# Patient Record
Sex: Male | Born: 1955 | Race: Black or African American | Hispanic: No | Marital: Married | Smoking: Current every day smoker
Health system: Southern US, Community
[De-identification: ages and names within clinical notes are randomized; demographics above are authoritative.]

---

## 2013-05-17 DIAGNOSIS — F1410 Cocaine abuse, uncomplicated: Secondary | ICD-10-CM | POA: Insufficient documentation

## 2013-05-17 DIAGNOSIS — F1010 Alcohol abuse, uncomplicated: Secondary | ICD-10-CM | POA: Insufficient documentation

## 2013-05-17 DIAGNOSIS — F329 Major depressive disorder, single episode, unspecified: Secondary | ICD-10-CM | POA: Insufficient documentation

## 2018-06-04 ENCOUNTER — Observation Stay
Admission: EM | Admit: 2018-06-04 | Discharge: 2018-06-05 | Disposition: A | Discharge status: Home / Self Care | Payer: PRIVATE HEALTH INSURANCE | Attending: Family Medicine | Admitting: Family Medicine

## 2018-06-04 ENCOUNTER — Emergency Department: Payer: PRIVATE HEALTH INSURANCE

## 2018-06-04 ENCOUNTER — Encounter: Payer: Self-pay | Admitting: Intensive Care

## 2018-06-04 ENCOUNTER — Other Ambulatory Visit: Payer: Self-pay

## 2018-06-04 DIAGNOSIS — F17210 Nicotine dependence, cigarettes, uncomplicated: Secondary | ICD-10-CM | POA: Insufficient documentation

## 2018-06-04 DIAGNOSIS — R000 Tachycardia, unspecified: Secondary | ICD-10-CM | POA: Diagnosis not present

## 2018-06-04 DIAGNOSIS — R079 Chest pain, unspecified: Secondary | ICD-10-CM | POA: Diagnosis not present

## 2018-06-04 DIAGNOSIS — E1165 Type 2 diabetes mellitus with hyperglycemia: Secondary | ICD-10-CM | POA: Insufficient documentation

## 2018-06-04 DIAGNOSIS — I081 Rheumatic disorders of both mitral and tricuspid valves: Secondary | ICD-10-CM | POA: Diagnosis not present

## 2018-06-04 DIAGNOSIS — R918 Other nonspecific abnormal finding of lung field: Secondary | ICD-10-CM | POA: Insufficient documentation

## 2018-06-04 LAB — BASIC METABOLIC PANEL
Anion gap: 7 (ref 5–15)
BUN: 13 mg/dL (ref 8–23)
CO2: 21 mmol/L — ABNORMAL LOW (ref 22–32)
Calcium: 8.9 mg/dL (ref 8.9–10.3)
Chloride: 108 mmol/L (ref 98–111)
Creatinine, Ser: 1.18 mg/dL (ref 0.61–1.24)
GFR calc Af Amer: >60 mL/min (ref >60)
GFR calc non Af Amer: >60 mL/min (ref >60)
Glucose, Bld: 204 mg/dL — ABNORMAL HIGH (ref 70–99)
POTASSIUM: 4 mmol/L (ref 3.5–5.1)
Sodium: 136 mmol/L (ref 135–145)

## 2018-06-04 LAB — CBC
HCT: 51.3 % (ref 39.0–52.0)
Hemoglobin: 17.4 g/dL — ABNORMAL HIGH (ref 13.0–17.0)
MCH: 32.1 pg (ref 26.0–34.0)
MCHC: 33.9 g/dL (ref 30.0–36.0)
MCV: 94.6 fL (ref 80.0–100.0)
NRBC: 0 % (ref 0.0–0.2)
Platelets: 305 10*3/uL (ref 150–400)
RBC: 5.42 MIL/uL (ref 4.22–5.81)
RDW: 12.7 % (ref 11.5–15.5)
WBC: 9.1 10*3/uL (ref 4.0–10.5)

## 2018-06-04 LAB — TROPONIN I
Troponin I: <0.03 ng/mL (ref <0.03)
Troponin I: <0.03 ng/mL (ref <0.03)

## 2018-06-04 LAB — TSH: TSH: 2.346 u[IU]/mL (ref 0.350–4.500)

## 2018-06-04 LAB — GLUCOSE, CAPILLARY: Glucose-Capillary: 127 mg/dL — ABNORMAL HIGH (ref 70–99)

## 2018-06-04 MED ORDER — INSULIN ASPART 100 UNIT/ML ~~LOC~~ SOLN
0.0000 [IU] | Freq: Three times a day (TID) | SUBCUTANEOUS | Status: DC
  Administered 2018-06-05: 3 [IU] via SUBCUTANEOUS
  Filled 2018-06-04: qty 1

## 2018-06-04 MED ORDER — ACETAMINOPHEN 325 MG PO TABS: 650.0000 mg | ORAL_TABLET | ORAL | Status: DC | PRN

## 2018-06-04 MED ORDER — IOHEXOL 350 MG/ML SOLN
75.0000 mL | Freq: Once | INTRAVENOUS | Status: AC | PRN
  Administered 2018-06-04: 75 mL via INTRAVENOUS

## 2018-06-04 MED ORDER — ALPRAZOLAM 0.25 MG PO TABS: 0.2500 mg | ORAL_TABLET | Freq: Two times a day (BID) | ORAL | Status: DC | PRN

## 2018-06-04 MED ORDER — HEPARIN SODIUM (PORCINE) 5000 UNIT/ML IJ SOLN
5000.0000 [IU] | Freq: Three times a day (TID) | INTRAMUSCULAR | Status: DC
  Administered 2018-06-04 – 2018-06-05 (×3): 5000 [IU] via SUBCUTANEOUS
  Filled 2018-06-04 (×3): qty 1

## 2018-06-04 MED ORDER — ASPIRIN EC 325 MG PO TBEC
325.0000 mg | DELAYED_RELEASE_TABLET | Freq: Every day | ORAL | Status: DC
  Administered 2018-06-05: 325 mg via ORAL
  Filled 2018-06-04: qty 1

## 2018-06-04 MED ORDER — INSULIN ASPART 100 UNIT/ML ~~LOC~~ SOLN: 0.0000 [IU] | Freq: Every day | SUBCUTANEOUS | Status: DC

## 2018-06-04 MED ORDER — METOPROLOL TARTRATE 25 MG PO TABS
25.0000 mg | ORAL_TABLET | Freq: Two times a day (BID) | ORAL | Status: DC
  Administered 2018-06-04: 25 mg via ORAL
  Filled 2018-06-04: qty 1

## 2018-06-04 MED ORDER — ATORVASTATIN CALCIUM 20 MG PO TABS: 80.0000 mg | ORAL_TABLET | Freq: Every day | ORAL | Status: DC

## 2018-06-04 MED ORDER — ASPIRIN 81 MG PO CHEW
324.0000 mg | CHEWABLE_TABLET | Freq: Once | ORAL | Status: AC
  Administered 2018-06-04: 324 mg via ORAL
  Filled 2018-06-04: qty 4

## 2018-06-04 MED ORDER — IPRATROPIUM-ALBUTEROL 0.5-2.5 (3) MG/3ML IN SOLN: 3.0000 mL | Freq: Four times a day (QID) | RESPIRATORY_TRACT | Status: DC | PRN

## 2018-06-04 MED ORDER — NITROGLYCERIN 0.4 MG SL SUBL: 0.4000 mg | SUBLINGUAL_TABLET | SUBLINGUAL | Status: DC | PRN

## 2018-06-04 MED ORDER — MORPHINE SULFATE (PF) 2 MG/ML IV SOLN: 2.0000 mg | INTRAVENOUS | Status: DC | PRN

## 2018-06-04 MED ORDER — ONDANSETRON HCL 4 MG/2ML IJ SOLN: 4.0000 mg | Freq: Four times a day (QID) | INTRAMUSCULAR | Status: DC | PRN

## 2018-06-04 NOTE — ED Notes (Signed)
Floor, Carlos Li, called for pt coming

## 2018-06-04 NOTE — Progress Notes (Signed)
Talked to Dr. Emmit Pomfret about patient's still complaining of 5/10 chest pain, order for Nitro given and per MD will round to patient. RN will continue to monitor.

## 2018-06-04 NOTE — ED Notes (Signed)
Pt given cold food tray at this time .

## 2018-06-04 NOTE — ED Triage Notes (Signed)
Patient c/o throbbing chest pain that radiates to neck since yesterday

## 2018-06-04 NOTE — ED Notes (Signed)
ED TO INPATIENT HANDOFF REPORT  Name/Age/Gender Carlos Li 63 y.o. male  Code Status   Home/SNF/Other Home  Chief Complaint Chest Pain  Level of Care/Admitting Diagnosis ED Disposition    ED Disposition Condition Comment   Admit  Hospital Area: [facility] [[id number]]  Level of Care: Telemetry [5]  Diagnosis: Chest pain, rule out acute myocardial infarction [678938]  Admitting Physician: [name] [[id number]]  Attending Physician: [name] [[id number]]  PT Class (Do Not Modify): Observation [104]  PT Acc Code (Do Not Modify): Observation [10022]       Medical History History reviewed. No pertinent past medical history.  Allergies No Known Allergies  IV Location/Drains/Wounds Patient Lines/Drains/Airways Status   Active Line/Drains/Airways    Name:   Placement date:   Placement time:   Site:   Days:   Peripheral IV 06/04/18 Right Forearm   06/04/18    1838    Forearm   less than 1          Labs/Imaging Results for orders placed or performed during the hospital encounter of 06/04/18 (from the past 48 hour(s))  Basic metabolic panel     Status: Abnormal   Collection Time: 06/04/18  4:12 PM  Result Value Ref Range   Sodium 136 135 - 145 mmol/L   Potassium 4.0 3.5 - 5.1 mmol/L   Chloride 108 98 - 111 mmol/L   CO2 21 (L) 22 - 32 mmol/L   Glucose, Bld 204 (H) 70 - 99 mg/dL   BUN 13 8 - 23 mg/dL   Creatinine, Ser 8.24 0.61 - 1.24 mg/dL   Calcium 8.9 8.9 - 23.5 mg/dL   GFR calc non Af Amer >60 >60 mL/min   GFR calc Af Amer >60 >60 mL/min   Anion gap 7 5 - 15    Comment: Performed at [facility], [address] [place], [place] [address]  CBC     Status: Abnormal   Collection Time: 06/04/18  4:12 PM  Result Value Ref Range   WBC 9.1 4.0 - 10.5 K/uL   RBC 5.42 4.22 - 5.81 MIL/uL   Hemoglobin 17.4 (H) 13.0 - 17.0 g/dL   HCT 31.5 40.0 - 86.7 %   MCV 94.6 80.0 - 100.0 fL   MCH 32.1 26.0 - 34.0 pg   MCHC 33.9 30.0  - 36.0 g/dL   RDW 61.9 50.9 - 32.6 %   Platelets 305 150 - 400 K/uL   nRBC 0.0 0.0 - 0.2 %    Comment: Performed at [facility], [address] [place], [place] [address]  Troponin I - ONCE - STAT     Status: None   Collection Time: 06/04/18  4:12 PM  Result Value Ref Range   Troponin I <0.03 <0.03 ng/mL    Comment: Performed at [facility], [address], [place], [place] [address]   Dg Chest 2 View  Result Date: 06/04/2018 CLINICAL DATA:  Chest pain for 2 days. EXAM: CHEST - 2 VIEW COMPARISON:  None. FINDINGS: Peribronchial thickening is identified. Mild flattening of the hemidiaphragms and enlargement of the retrosternal air space are seen. Lungs are clear. Heart size is normal. No pneumothorax or pleural effusion. No acute or focal bony abnormality. IMPRESSION: No acute disease. Findings compatible with COPD. Electronically Signed   By: Drusilla Kanner M.D.   On: 06/04/2018 17:18   Ct Angio Chest Pe W And/or Wo Contrast  Result Date: 06/04/2018 CLINICAL DATA:  Throbbing chest pain radiating  to neck since yesterday. EXAM: CT ANGIOGRAPHY CHEST WITH CONTRAST TECHNIQUE: Multidetector CT imaging of the chest was performed using the standard protocol during bolus administration of intravenous contrast. Multiplanar CT image reconstructions and MIPs were obtained to evaluate the vascular anatomy. CONTRAST:  75mL OMNIPAQUE IOHEXOL 350 MG/ML SOLN COMPARISON:  Chest radiograph 06/04/2018 FINDINGS: CARDIOVASCULAR: Adequate contrast opacification of the pulmonary artery's. Main pulmonary artery is not enlarged. No pulmonary arterial filling defects to the level of the subsegmental branches. Heart size is normal. Trace coronary artery calcification. No pericardial effusion. Thoracic aorta is normal course and caliber, unremarkable. MEDIASTINUM/NODES: No lymphadenopathy by CT size criteria. Small calcified LEFT hilar lymph nodes. LUNGS/PLEURA: Tracheobronchial tree is patent, no  pneumothorax. Mild bronchial wall thickening. 7 mm RIGHT upper lobe subsolid pulmonary nodule (series 6, image 46), 5 mm LEFT lower lobe ground-glass nodule (series 6, image 48). Mild mosaic lung attenuation. UPPER ABDOMEN: Non-acute.  Punctate calcified splenic granuloma. MUSCULOSKELETAL: Non-acute. Mildly thickened and sclerotic RIGHT posterior eighth rib suggesting Paget's disease. No destructive bony lesions. Review of the MIP images confirms the above findings. IMPRESSION: 1. No acute pulmonary embolism. 2. Bronchial wall thickening seen with bronchitis or reactive airway disease. CT findings of small airway disease. No pneumonia. 3. **An incidental finding of potential clinical significance has been found. Multiple pulmonary nodules measuring to 7 mm. Non-contrast chest CT at 3-6 months is recommended. If nodules persist, subsequent management will be based upon the most suspicious nodule(s). This recommendation follows the consensus statement: Guidelines for Management of Incidental Pulmonary Nodules Detected on CT Images: From the Fleischner Society 2017; Radiology 2017; 284:228-243.** Electronically Signed   By: Awilda Metroourtnay  Bloomer M.D.   On: 06/04/2018 19:15    Pending Labs [employer] CorporationUnresulted Labs (From admission, onward)    Start     Ordered   Signed and Held  HIV antibody (Routine Testing)  Once,   R     Signed and Held   Signed and Held  Troponin I - Now Then Q3H  Now then every 3 hours,   TIMED     Signed and Held   Signed and Held  CBC  (heparin)  Once,   R    Comments:  Baseline for heparin therapy IF NOT ALREADY DRAWN.  Notify MD if PLT < 100 K.    Signed and Held   Signed and Held  Creatinine, serum  (heparin)  Once,   R    Comments:  Baseline for heparin therapy IF NOT ALREADY DRAWN.    Signed and Held   Signed and Held  TSH  Once,   R     Signed and Held   Signed and Held  Lipid panel  Tomorrow morning,   R     Signed and Held   Signed and Held  Hemoglobin A1c  Once,   R    Comments:   To assess prior glycemic control    Signed and Held          Vitals/Pain Today's Vitals   06/04/18 1608 06/04/18 1730 06/04/18 1738 06/04/18 1800  BP: 129/75 116/72  124/85  Pulse: 96 69  73  Resp: 16   19  Temp: 97.8 F (36.6 C)     TempSrc: Oral     SpO2: 96% 96%  96%  Weight:      Height:      PainSc:   8      Isolation Precautions No active isolations  Medications Medications  iohexol (OMNIPAQUE) 350 MG/ML  injection 75 mL (75 mLs Intravenous Contrast Given 06/04/18 1857)  aspirin chewable tablet 324 mg (324 mg Oral Given 06/04/18 2030)    Mobility walks

## 2018-06-04 NOTE — ED Provider Notes (Signed)
[facility] Emergency Department Provider Note  ____________________________________________   I have reviewed the triage vital signs and the nursing notes. Where available I have reviewed prior notes and, if possible and indicated, outside hospital notes.    HISTORY  Chief Complaint Chest Pain    HPI Carlos Li is a 63 y.o. male  with a history of extensive tobacco abuse, he does not regularly see Dr. states over the last 2 days he is had chest pain, mostly right after he ambulates, he has to stop.  The pain lasts for a brief period of time and goes away.  It is "everywhere in his chest", goes up towards his arms and his neck.  And then gradually subsides.  It is a "pain", he cannot further describe it. Marland Kitchen.  He denies any nausea or vomiting he is not particularly short of breath he has no leg swelling.  The patient is a very limited historian level 5 chart caveat; no further history available due to patient status.   History reviewed. No pertinent past medical history.  There are no active problems to display for this patient.   History reviewed. No pertinent surgical history.  Prior to Admission medications   Not on File    Allergies Patient has no known allergies.  History reviewed. No pertinent family history.  Social History Social History   Tobacco Use  . Smoking status: Current Every Day Smoker    Types: Cigarettes  . Smokeless tobacco: Never Used  Substance Use Topics  . Alcohol use: Yes  . Drug use: Not Currently    Review of Systems Constitutional: No fever/chills Eyes: No visual changes. ENT: No sore throat. No stiff neck no neck pain Cardiovascular: + chest pain. Respiratory: Denies shortness of breath. Gastrointestinal:   no vomiting.  No diarrhea.  No constipation. Genitourinary: Negative for dysuria. Musculoskeletal: Negative lower extremity swelling Skin: Negative for rash. Neurological: Negative for severe  headaches, focal weakness or numbness.   ____________________________________________   PHYSICAL EXAM:  VITAL SIGNS: ED Triage Vitals  Enc Vitals Group     BP 06/04/18 1608 129/75     Pulse Rate 06/04/18 1608 96     Resp 06/04/18 1608 16     Temp 06/04/18 1608 97.8 F (36.6 C)     Temp Source 06/04/18 1608 Oral     SpO2 06/04/18 1608 96 %     Weight 06/04/18 1605 150 lb (68 kg)     Height 06/04/18 1605 5\' 7"  (1.702 m)     Head Circumference --      Peak Flow --      Pain Score 06/04/18 1605 7     Pain Loc --      Pain Edu? --      Excl. in GC? --     Constitutional: Alert and oriented. Well appearing and in no acute distress. Eyes: Conjunctivae are normal Head: Atraumatic HEENT: No congestion/rhinnorhea. Mucous membranes are moist.  Oropharynx non-erythematous Neck:   Nontender with no meningismus, no masses, no stridor Cardiovascular: Normal rate, regular rhythm. Grossly normal heart sounds.  Good peripheral circulation. Respiratory: Normal respiratory effort.  No retractions. Lungs CTAB. Abdominal: Soft and nontender. No distention. No guarding no rebound Back:  There is no focal tenderness or step off.  there is no midline tenderness there are no lesions noted. there is no CVA tenderness Musculoskeletal: No lower extremity tenderness, no upper extremity tenderness. No joint effusions, no DVT signs strong distal pulses  no edema Neurologic:  Normal speech and language. No gross focal neurologic deficits are appreciated.  Skin:  Skin is warm, dry and intact. No rash noted. Psychiatric: Mood and affect are normal. Speech and behavior are normal.  ____________________________________________   LABS (all labs ordered are listed, but only abnormal results are displayed)  Labs Reviewed  BASIC METABOLIC PANEL - Abnormal; Notable for the following components:      Result Value   CO2 21 (*)    Glucose, Bld 204 (*)    All other components within normal limits  CBC -  Abnormal; Notable for the following components:   Hemoglobin 17.4 (*)    All other components within normal limits  TROPONIN I    Pertinent labs  results that were available during my care of the patient were reviewed by me and considered in my medical decision making (see chart for details). ____________________________________________  EKG  I personally interpreted any EKGs ordered by me or triage Sinus tachycardia rate 102, no acute ST elevation or depression normal axis ______________________________________  RADIOLOGY  Pertinent labs & imaging results that were available during my care of the patient were reviewed by me and considered in my medical decision making (see chart for details). If possible, patient and/or family made aware of any abnormal findings.  Dg Chest 2 View  Result Date: 06/04/2018 CLINICAL DATA:  Chest pain for 2 days. EXAM: CHEST - 2 VIEW COMPARISON:  None. FINDINGS: Peribronchial thickening is identified. Mild flattening of the hemidiaphragms and enlargement of the retrosternal air space are seen. Lungs are clear. Heart size is normal. No pneumothorax or pleural effusion. No acute or focal bony abnormality. IMPRESSION: No acute disease. Findings compatible with COPD. Electronically Signed   By: Drusilla Kanner M.D.   On: 06/04/2018 17:18   Ct Angio Chest Pe W And/or Wo Contrast  Result Date: 06/04/2018 CLINICAL DATA:  Throbbing chest pain radiating to neck since yesterday. EXAM: CT ANGIOGRAPHY CHEST WITH CONTRAST TECHNIQUE: Multidetector CT imaging of the chest was performed using the standard protocol during bolus administration of intravenous contrast. Multiplanar CT image reconstructions and MIPs were obtained to evaluate the vascular anatomy. CONTRAST:  35mL OMNIPAQUE IOHEXOL 350 MG/ML SOLN COMPARISON:  Chest radiograph 06/04/2018 FINDINGS: CARDIOVASCULAR: Adequate contrast opacification of the pulmonary artery's. Main pulmonary artery is not enlarged. No  pulmonary arterial filling defects to the level of the subsegmental branches. Heart size is normal. Trace coronary artery calcification. No pericardial effusion. Thoracic aorta is normal course and caliber, unremarkable. MEDIASTINUM/NODES: No lymphadenopathy by CT size criteria. Small calcified LEFT hilar lymph nodes. LUNGS/PLEURA: Tracheobronchial tree is patent, no pneumothorax. Mild bronchial wall thickening. 7 mm RIGHT upper lobe subsolid pulmonary nodule (series 6, image 46), 5 mm LEFT lower lobe ground-glass nodule (series 6, image 48). Mild mosaic lung attenuation. UPPER ABDOMEN: Non-acute.  Punctate calcified splenic granuloma. MUSCULOSKELETAL: Non-acute. Mildly thickened and sclerotic RIGHT posterior eighth rib suggesting Paget's disease. No destructive bony lesions. Review of the MIP images confirms the above findings. IMPRESSION: 1. No acute pulmonary embolism. 2. Bronchial wall thickening seen with bronchitis or reactive airway disease. CT findings of small airway disease. No pneumonia. 3. **An incidental finding of potential clinical significance has been found. Multiple pulmonary nodules measuring to 7 mm. Non-contrast chest CT at 3-6 months is recommended. If nodules persist, subsequent management will be based upon the most suspicious nodule(s). This recommendation follows the consensus statement: Guidelines for Management of Incidental Pulmonary Nodules Detected on CT Images: From the Fleischner  Society 2017; Radiology 2017; [id number]:228-243.** Electronically Signed   By: Awilda Metroourtnay  Bloomer M.D.   On: 06/04/2018 19:15   ____________________________________________    PROCEDURES  Procedure(s) performed: None  Procedures  Critical Care performed: None  ____________________________________________   INITIAL IMPRESSION / ASSESSMENT AND PLAN / ED COURSE  Pertinent labs & imaging results that were available during my care of the patient were reviewed by me and considered in my medical decision  making (see chart for details).  Here with multiple risk factors for CAD including tobacco abuse, he does not follow regularly with doctors he states, presents today complaining of chest pain which seems to be exertional and seems to get better at rest.  Patient states it radiates towards his neck.  I did do a CT scan which is negative fortunately for PE or evidence of dissection.  Low suspicion for dissection given the quality and nature of his pain as he describes it.  Patient is remarkably poor historian.  Is very difficult to calculate a heart score is with very little information on him but I suspect it to be elevated.  Given that he has exertional chest pain multiple risk factors are presumed including hyperglycemia suggestive of an undiagnosed diabetes etc. we will admit the patient for further observation    ____________________________________________   FINAL CLINICAL IMPRESSION(S) / ED DIAGNOSES  Final diagnoses:  Chest pain, unspecified type      This chart was dictated using voice recognition software.  Despite best efforts to proofread,  errors can occur which can change meaning.  Jeanmarie Plant\   Reather Steller A, MD 06/04/18 2024

## 2018-06-04 NOTE — H&P (Signed)
[facility] [place] [facility] Admission History and Physical [name], D.O.  ---------------------------------------------------------------------------------------------------------------------   PATIENT NAME: Carlos Li MR#: [id number] DATE OF BIRTH: 1955-06-28 DATE OF ADMISSION: 06/04/2018 PRIMARY CARE PHYSICIAN: Patient, No Pcp Per  REQUESTING/REFERRING PHYSICIAN: ED Dr. Alphonzo Lemmings  CHIEF COMPLAINT: Chief Complaint  Patient presents with  . Chest Pain    HISTORY OF PRESENT ILLNESS: Carlos Li is a 63 y.o. male with a known history of tobacco use, doesn't see doctors often, presents to the emergency department for evaluation of chest pain.  Patient was in a usual state of health until yesterday when he reports the onset of left sided chest pain radiating into his left arm, occurs during ambulation improves with rest.  Pain has been intermittent over the past 24 hours.  No associated with shortness of breath, nausea, diaphoresis.  He reports a chronic nighttime cough.  He admits that he has not been to a doctor in many years, is unaware of any other medical problems.  Has used cocaine in the past, last time being 2-3 weeks ago.  Smoke 1/3rd pack of cigarettes daily, drinks 1 40 ounce beer daily.  Works [employer] trader.    Otherwise there has been no change in status. Patient has been taking medication as prescribed and there has been no recent change in medication or diet.  There has been no recent illness, travel or sick contacts.    Patient denies fevers/chills, weakness, dizziness, shortness of breath, N/V/C/D, abdominal pain, dysuria/frequency, changes in mental status.   EMS/ED COURSE:   Patient received aspirin.  Medical admission was requested for further workup and management of chest pain, rule out ACS  PAST MEDICAL HISTORY: Denies    PAST SURGICAL HISTORY: Abdominal and knee surgeries in the past    SOCIAL HISTORY: Social History   Tobacco Use   . Smoking status: Current Every Day Smoker    Types: Cigarettes  . Smokeless tobacco: Never Used  Substance Use Topics  . Alcohol use: Yes      FAMILY HISTORY: Denies   MEDICATIONS AT HOME: Prior to Admission medications   Not on File   Takes no medications  DRUG ALLERGIES: No Known Allergies   REVIEW OF SYSTEMS: CONSTITUTIONAL: No fatigue, weakness, fever, chills, weight gain/loss, headache EYES: No blurry or double vision. ENT: No tinnitus, postnasal drip, redness or soreness of the oropharynx. RESPIRATORY: No dyspnea, cough, wheeze, hemoptysis. CARDIOVASCULAR: Positive chest and left arm pain, negative orthopnea, palpitations, syncope. GASTROINTESTINAL: No nausea, vomiting, constipation, diarrhea, abdominal pain. No hematemesis, melena or hematochezia. GENITOURINARY: No dysuria, frequency, hematuria. ENDOCRINE: No polyuria or nocturia. No heat or cold intolerance. HEMATOLOGY: No anemia, bruising, bleeding. INTEGUMENTARY: No rashes, ulcers, lesions. MUSCULOSKELETAL: No pain, arthritis, swelling, gout. NEUROLOGIC: No numbness, tingling, weakness or ataxia. No seizure-type activity. PSYCHIATRIC: No anxiety, depression, insomnia.  PHYSICAL EXAMINATION: VITAL SIGNS: Blood pressure 124/85, pulse 73, temperature 97.8 F (36.6 C), temperature source Oral, resp. rate 19, height 5\' 7"  (1.702 m), weight 68 kg, SpO2 96 %.  GENERAL: 63 y.o.-year-old male patient, well-developed, well-nourished lying in the bed in no acute distress.  Pleasant and cooperative.   HEENT: Head atraumatic, normocephalic. Pupils equal, round, reactive to light and accommodation. No scleral icterus. Extraocular muscles intact. Oropharynx is clear. Mucus membranes moist. NECK: Supple, full range of motion. No JVD, no bruit heard. No cervical lymphadenopathy. CHEST: Normal breath sounds bilaterally. No wheezing, rales, rhonchi or crackles. No use of accessory muscles of respiration.  No reproducible chest  wall tenderness.  CARDIOVASCULAR: S1, S2 normal. No murmurs, rubs, or gallops appreciated. Cap refill <2 seconds. ABDOMEN: Soft, nontender, nondistended. No rebound, guarding, rigidity. Normoactive bowel sounds present in all four quadrants. No organomegaly or mass. EXTREMITIES: Full range of motion. No pedal edema, cyanosis, or clubbing. NEUROLOGIC: Cranial nerves II through XII are grossly intact with no focal sensorimotor deficit. Muscle strength 5/5 in all extremities. Sensation intact. Gait not checked. PSYCHIATRIC: The patient is alert and oriented x 3. Normal affect, mood, thought content. SKIN: Warm, dry, and intact without obvious rash, lesion, or ulcer.  LABORATORY PANEL:  CBC Recent Labs  Lab 06/04/18 1612  WBC 9.1  HGB 17.4*  HCT 51.3  PLT 305   ----------------------------------------------------------------------------------------------------------------- Chemistries Recent Labs  Lab 06/04/18 1612  NA 136  K 4.0  CL 108  CO2 21*  GLUCOSE 204*  BUN 13  CREATININE 1.18  CALCIUM 8.9   ------------------------------------------------------------------------------------------------------------------ Cardiac Enzymes Recent Labs  Lab 06/04/18 1612  TROPONINI <0.03   ------------------------------------------------------------------------------------------------------------------  RADIOLOGY: Dg Chest 2 View  Result Date: 06/04/2018 CLINICAL DATA:  Chest pain for 2 days. EXAM: CHEST - 2 VIEW COMPARISON:  None. FINDINGS: Peribronchial thickening is identified. Mild flattening of the hemidiaphragms and enlargement of the retrosternal air space are seen. Lungs are clear. Heart size is normal. No pneumothorax or pleural effusion. No acute or focal bony abnormality. IMPRESSION: No acute disease. Findings compatible with COPD. Electronically Signed   By: Drusilla Kannerhomas  Dalessio M.D.   On: 06/04/2018 17:18   Ct Angio Chest Pe W And/or Wo Contrast  Result Date:  06/04/2018 CLINICAL DATA:  Throbbing chest pain radiating to neck since yesterday. EXAM: CT ANGIOGRAPHY CHEST WITH CONTRAST TECHNIQUE: Multidetector CT imaging of the chest was performed using the standard protocol during bolus administration of intravenous contrast. Multiplanar CT image reconstructions and MIPs were obtained to evaluate the vascular anatomy. CONTRAST:  75mL OMNIPAQUE IOHEXOL 350 MG/ML SOLN COMPARISON:  Chest radiograph 06/04/2018 FINDINGS: CARDIOVASCULAR: Adequate contrast opacification of the pulmonary artery's. Main pulmonary artery is not enlarged. No pulmonary arterial filling defects to the level of the subsegmental branches. Heart size is normal. Trace coronary artery calcification. No pericardial effusion. Thoracic aorta is normal course and caliber, unremarkable. MEDIASTINUM/NODES: No lymphadenopathy by CT size criteria. Small calcified LEFT hilar lymph nodes. LUNGS/PLEURA: Tracheobronchial tree is patent, no pneumothorax. Mild bronchial wall thickening. 7 mm RIGHT upper lobe subsolid pulmonary nodule (series 6, image 46), 5 mm LEFT lower lobe ground-glass nodule (series 6, image 48). Mild mosaic lung attenuation. UPPER ABDOMEN: Non-acute.  Punctate calcified splenic granuloma. MUSCULOSKELETAL: Non-acute. Mildly thickened and sclerotic RIGHT posterior eighth rib suggesting Paget's disease. No destructive bony lesions. Review of the MIP images confirms the above findings. IMPRESSION: 1. No acute pulmonary embolism. 2. Bronchial wall thickening seen with bronchitis or reactive airway disease. CT findings of small airway disease. No pneumonia. 3. **An incidental finding of potential clinical significance has been found. Multiple pulmonary nodules measuring to 7 mm. Non-contrast chest CT at 3-6 months is recommended. If nodules persist, subsequent management will be based upon the most suspicious nodule(s). This recommendation follows the consensus statement: Guidelines for Management of  Incidental Pulmonary Nodules Detected on CT Images: From the Fleischner Society 2017; Radiology 2017; 284:228-243.** Electronically Signed   By: Awilda Metroourtnay  Bloomer M.D.   On: 06/04/2018 19:15    EKG: Sinus tach @ 102 bpm with normal axis and nonspecific ST-T wave changes.   IMPRESSION AND PLAN:  This is a 63 y.o. male with a  history of tobacco use now being admitted with: 1. Chest pain, rule out ACS - Admit to observation with telemetry monitoring. - Trend troponins, check lipids and TSH. - Morphine, nitro, beta blocker, aspirin and statin ordered.   - Check echo - Cardiology consult requested.   2. Possibly bronchitis vs. RAD - O2, Nebs PRN,  2. Hyperglycemia, new onset DM - Accuchecks achs with RISS coverage - Heart healthy, carb controlled diet - Check A1C  Admission status: Observation, telemetry Diet/Nutrition: Heart healthy Fluids: HL DVT Px: Lovenox, SCDs and early ambulation Code Status: Full Disposition Plan: To home in <24 hours  All the records are reviewed and case discussed with ED provider. Management plans discussed with the patient and/or family who express understanding and agree with plan of care.   TOTAL TIME TAKING CARE OF THIS PATIENT: 60 minutes.   Candy Ziegler D.O. on 06/04/2018 at 9:00 PM Between 7am to 6pm - Pager - [phone] After 6pm go to [email or web address] - Social research officer, government [facility] Office [phone] CC: Primary care physician; Patient, No Pcp Per     Note: This dictation was prepared with Dragon dictation along with smaller phrase technology. Any transcriptional errors that result from this process are unintentional.

## 2018-06-05 ENCOUNTER — Observation Stay: Payer: PRIVATE HEALTH INSURANCE

## 2018-06-05 ENCOUNTER — Observation Stay
Admit: 2018-06-05 | Discharge: 2018-06-05 | Disposition: A | Discharge status: Home / Self Care | Payer: PRIVATE HEALTH INSURANCE | Attending: Family Medicine | Admitting: Family Medicine

## 2018-06-05 ENCOUNTER — Other Ambulatory Visit: Payer: Self-pay

## 2018-06-05 LAB — NM MYOCAR MULTI W/SPECT W/WALL MOTION / EF
CSEPED: 7 min
Estimated workload: 9.3 METS
Exercise duration (sec): 31 s
LV dias vol: 32 mL (ref 62–150)
LV sys vol: 10 mL
Peak HR: 113 {beats}/min
Rest HR: 65 {beats}/min
SDS: 0
SRS: 0
SSS: 0
TID: 0.9

## 2018-06-05 LAB — LIPID PANEL
Cholesterol: 151 mg/dL (ref 0–200)
HDL: 49 mg/dL (ref >40)
LDL Cholesterol: 61 mg/dL (ref 0–99)
Total CHOL/HDL Ratio: 3.1 RATIO
Triglycerides: 206 mg/dL — ABNORMAL HIGH (ref <150)
VLDL: 41 mg/dL — ABNORMAL HIGH (ref 0–40)

## 2018-06-05 LAB — TROPONIN I: Troponin I: <0.03 ng/mL (ref <0.03)

## 2018-06-05 LAB — HEMOGLOBIN A1C
Hgb A1c MFr Bld: 5.5 % (ref 4.8–5.6)
Mean Plasma Glucose: 111.15 mg/dL

## 2018-06-05 LAB — GLUCOSE, CAPILLARY
Glucose-Capillary: 130 mg/dL — ABNORMAL HIGH (ref 70–99)
Glucose-Capillary: 153 mg/dL — ABNORMAL HIGH (ref 70–99)

## 2018-06-05 LAB — ECHOCARDIOGRAM COMPLETE
Height: 67 in
Weight: 2385.6 oz

## 2018-06-05 MED ORDER — AZITHROMYCIN 250 MG PO TABS: ORAL_TABLET | ORAL | 0 refills | Status: DC

## 2018-06-05 MED ORDER — TECHNETIUM TC 99M TETROFOSMIN IV KIT
30.5800 | PACK | Freq: Once | INTRAVENOUS | Status: AC | PRN
  Administered 2018-06-05: 30.58 via INTRAVENOUS

## 2018-06-05 MED ORDER — MORPHINE SULFATE (PF) 2 MG/ML IV SOLN
1.0000 mg | INTRAVENOUS | Status: DC | PRN
  Administered 2018-06-05: 1 mg via INTRAVENOUS
  Filled 2018-06-05: qty 1

## 2018-06-05 MED ORDER — PANTOPRAZOLE SODIUM 40 MG IV SOLR
40.0000 mg | INTRAVENOUS | Status: DC
  Administered 2018-06-05: 40 mg via INTRAVENOUS
  Filled 2018-06-05: qty 40

## 2018-06-05 MED ORDER — ASPIRIN EC 81 MG PO TBEC: 81.0000 mg | DELAYED_RELEASE_TABLET | Freq: Every day | ORAL | 0 refills | Status: DC

## 2018-06-05 MED ORDER — METOPROLOL TARTRATE 25 MG PO TABS: 25.0000 mg | ORAL_TABLET | Freq: Two times a day (BID) | ORAL | 0 refills | Status: DC

## 2018-06-05 MED ORDER — ATORVASTATIN CALCIUM 80 MG PO TABS: 80.0000 mg | ORAL_TABLET | Freq: Every day | ORAL | 0 refills | Status: DC

## 2018-06-05 MED ORDER — TECHNETIUM TC 99M TETROFOSMIN IV KIT
10.0300 | PACK | Freq: Once | INTRAVENOUS | Status: AC | PRN
  Administered 2018-06-05: 10.03 via INTRAVENOUS

## 2018-06-05 NOTE — Discharge Summary (Signed)
[facility] [place] [facility]   PATIENT NAME: Carlos Li    MR#:  [id number]  DATE OF BIRTH:  06/03/1955  DATE OF ADMISSION:  06/04/2018 ADMITTING PHYSICIAN: Jon Gills Hugelmeyer, DO  DATE OF DISCHARGE: No discharge date for patient encounter.  PRIMARY CARE PHYSICIAN: Patient, No Pcp Per    ADMISSION DIAGNOSIS:  Chest pain, unspecified type [R07.9]  DISCHARGE DIAGNOSIS:  Active Problems:   Chest pain, rule out acute myocardial infarction   SECONDARY DIAGNOSIS:  History reviewed. No pertinent past medical history.  HOSPITAL COURSE:  This is a 63 y.o. male with a history of tobacco use now being admitted with  *Acute chest pain Resolved Did r/o for ACS Cardiology did see patient while in house, treated on our ACS protocol while in house, echocardiogram done-report pending at the time of discharge, stress testing nl  * Acute possible bronchitis versus RAD Treated with breathing treatments PRN, tobacco cessation suggested, Zpack  *Acute hyperglycemia, mild Globin A1c is normal  *Acute abnormal CT of the chest Noted for bronchitis changes/pulmonary nodules Follow-up primary care provider status post discharge for continued care/medical management  DISCHARGE CONDITIONS:  stable  CONSULTS OBTAINED:  Treatment Team:  Marcina Millard, MD  DRUG ALLERGIES:  No Known Allergies  DISCHARGE MEDICATIONS:   Allergies as of 06/05/2018   No Known Allergies     Medication List    TAKE these medications   aspirin EC 81 MG tablet Take 1 tablet (81 mg total) by mouth daily.   atorvastatin 80 MG tablet Commonly known as:  LIPITOR Take 1 tablet (80 mg total) by mouth daily at 6 PM.   azithromycin 250 MG tablet Commonly known as:  ZITHROMAX Take as directed   metoprolol tartrate 25 MG tablet Commonly known as:  LOPRESSOR Take 1 tablet (25 mg total) by mouth 2 (two) times daily.        DISCHARGE INSTRUCTIONS:   If you experience  worsening of your admission symptoms, develop shortness of breath, life threatening emergency, suicidal or homicidal thoughts you must seek medical attention immediately by calling 911 or calling your MD immediately  if symptoms less severe.  You Must read complete instructions/literature along with all the possible adverse reactions/side effects for all the Medicines you take and that have been prescribed to you. Take any new Medicines after you have completely understood and accept all the possible adverse reactions/side effects.   Please note  You were cared for by a hospitalist during your hospital stay. If you have any questions about your discharge medications or the care you received while you were in the hospital after you are discharged, you can call the unit and asked to speak with the hospitalist on call if the hospitalist that took care of you is not available. Once you are discharged, your primary care physician will handle any further medical issues. Please note that NO REFILLS for any discharge medications will be authorized once you are discharged, as it is imperative that you return to your primary care physician (or establish a relationship with a primary care physician if you do not have one) for your aftercare needs so that they can reassess your need for medications and monitor your lab values.    Today   CHIEF COMPLAINT:   Chief Complaint  Patient presents with  . Chest Pain    HISTORY OF PRESENT ILLNESS:  63 y.o. male with a known history of tobacco use, doesn't see doctors often, presents to the  emergency department for evaluation of chest pain.  Patient was in a usual state of health until yesterday when he reports the onset of left sided chest pain radiating into his left arm, occurs during ambulation improves with rest.  Pain has been intermittent over the past 24 hours.  No associated with shortness of breath, nausea, diaphoresis.  He reports a chronic nighttime cough.   He admits that he has not been to a doctor in many years, is unaware of any other medical problems.  Has used cocaine in the past, last time being 2-3 weeks ago.  Smoke 1/3rd pack of cigarettes daily, drinks 1 40 ounce beer daily.  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    Otherwise there has been no change in status. Patient has been taking medication as prescribed and there has been no recent change in medication or diet.  There has been no recent illness, travel or sick contacts.    Patient denies fevers/chills, weakness, dizziness, shortness of breath, N/V/C/D, abdominal pain, dysuria/frequency, changes in mental status.   VITAL SIGNS:  Blood pressure 92/61, pulse (!) 56, temperature 97.7 F (36.5 C), temperature source Oral, resp. rate 16, height 5\' 7"  (1.702 m), weight 67.6 kg, SpO2 100 %.  I/O:    Intake/Output Summary (Last 24 hours) at 06/05/2018 1303 Last data filed at 06/05/2018 0550 Gross per 24 hour  Intake -  Output 400 ml  Net -400 ml    PHYSICAL EXAMINATION:  GENERAL:  63 y.o.-year-old patient lying in the bed with no acute distress.  EYES: Pupils equal, round, reactive to light and accommodation. No scleral icterus. Extraocular muscles intact.  HEENT: Head atraumatic, normocephalic. Oropharynx and nasopharynx clear.  NECK:  Supple, no jugular venous distention. No thyroid enlargement, no tenderness.  LUNGS: Normal breath sounds bilaterally, no wheezing, rales,rhonchi or crepitation. No use of accessory muscles of respiration.  CARDIOVASCULAR: S1, S2 normal. No murmurs, rubs, or gallops.  ABDOMEN: Soft, non-tender, non-distended. Bowel sounds present. No organomegaly or mass.  EXTREMITIES: No pedal edema, cyanosis, or clubbing.  NEUROLOGIC: Cranial nerves II through XII are intact. Muscle strength 5/5 in all extremities. Sensation intact. Gait not checked.  PSYCHIATRIC: The patient is alert and oriented x 3.  SKIN: No obvious rash, lesion, or ulcer.   DATA REVIEW:    CBC Recent Labs  Lab 06/04/18 1612  WBC 9.1  HGB 17.4*  HCT 51.3  PLT 305    Chemistries  Recent Labs  Lab 06/04/18 1612  NA 136  K 4.0  CL 108  CO2 21*  GLUCOSE 204*  BUN 13  CREATININE 1.18  CALCIUM 8.9    Cardiac Enzymes Recent Labs  Lab 06/05/18 0433  TROPONINI <0.03    Microbiology Results  No results found for this or any previous visit.  RADIOLOGY:  Dg Chest 2 View  Result Date: 06/04/2018 CLINICAL DATA:  Chest pain for 2 days. EXAM: CHEST - 2 VIEW COMPARISON:  None. FINDINGS: Peribronchial thickening is identified. Mild flattening of the hemidiaphragms and enlargement of the retrosternal air space are seen. Lungs are clear. Heart size is normal. No pneumothorax or pleural effusion. No acute or focal bony abnormality. IMPRESSION: No acute disease. Findings compatible with COPD. Electronically Signed   By: Drusilla Kannerhomas  Dalessio M.D.   On: 06/04/2018 17:18   Ct Angio Chest Pe W And/or Wo Contrast  Result Date: 06/04/2018 CLINICAL DATA:  Throbbing chest pain radiating to neck since yesterday. EXAM: CT ANGIOGRAPHY CHEST WITH CONTRAST TECHNIQUE: Multidetector CT imaging of the  chest was performed using the standard protocol during bolus administration of intravenous contrast. Multiplanar CT image reconstructions and MIPs were obtained to evaluate the vascular anatomy. CONTRAST:  46mL OMNIPAQUE IOHEXOL 350 MG/ML SOLN COMPARISON:  Chest radiograph 06/04/2018 FINDINGS: CARDIOVASCULAR: Adequate contrast opacification of the pulmonary artery's. Main pulmonary artery is not enlarged. No pulmonary arterial filling defects to the level of the subsegmental branches. Heart size is normal. Trace coronary artery calcification. No pericardial effusion. Thoracic aorta is normal course and caliber, unremarkable. MEDIASTINUM/NODES: No lymphadenopathy by CT size criteria. Small calcified LEFT hilar lymph nodes. LUNGS/PLEURA: Tracheobronchial tree is patent, no pneumothorax. Mild bronchial  wall thickening. 7 mm RIGHT upper lobe subsolid pulmonary nodule (series 6, image 46), 5 mm LEFT lower lobe ground-glass nodule (series 6, image 48). Mild mosaic lung attenuation. UPPER ABDOMEN: Non-acute.  Punctate calcified splenic granuloma. MUSCULOSKELETAL: Non-acute. Mildly thickened and sclerotic RIGHT posterior eighth rib suggesting Paget's disease. No destructive bony lesions. Review of the MIP images confirms the above findings. IMPRESSION: 1. No acute pulmonary embolism. 2. Bronchial wall thickening seen with bronchitis or reactive airway disease. CT findings of small airway disease. No pneumonia. 3. **An incidental finding of potential clinical significance has been found. Multiple pulmonary nodules measuring to 7 mm. Non-contrast chest CT at 3-6 months is recommended. If nodules persist, subsequent management will be based upon the most suspicious nodule(s). This recommendation follows the consensus statement: Guidelines for Management of Incidental Pulmonary Nodules Detected on CT Images: From the Fleischner Society 2017; Radiology 2017; 284:228-243.** Electronically Signed   By: Awilda Metro M.D.   On: 06/04/2018 19:15   Nm Myocar Multi W/spect W/wall Motion / Ef  Result Date: 06/05/2018  Blood pressure demonstrated a normal response to exercise.  There was no ST segment deviation noted during stress.  The study is normal.  This is a low risk study.  The left ventricular ejection fraction is mildly decreased (45-54%).     EKG:   Orders placed or performed during the hospital encounter of 06/04/18  . ED EKG  . ED EKG  . EKG 12-Lead  . EKG 12-Lead  . EKG 12-Lead (at 6am)  . EKG 12-Lead (Repeat cardiac markers, recurrent chest pain)  . EKG 12-Lead (Repeat cardiac markers, recurrent chest pain)  . EKG 12-Lead (at 6am)      Management plans discussed with the patient, family and they are in agreement.  CODE STATUS:     Code Status Orders  (From admission, onward)          Start     Ordered   06/04/18 2230  Full code  Continuous     06/04/18 2229        Code Status History    This patient has a current code status but no historical code status.      TOTAL TIME TAKING CARE OF THIS PATIENT: 40 minutes.    Evelena Asa Everlina Gotts M.D on 06/05/2018 at 1:03 PM  Between 7am to 6pm - Pager - [phone]  After 6pm go to [email or web address] - password [email or web address]  [facility]  Office  [phone]  CC: Primary care physician; Patient, No Pcp Per   Note: This dictation was prepared with Dragon dictation along with smaller phrase technology. Any transcriptional errors that result from this process are unintentional.

## 2018-06-05 NOTE — Progress Notes (Signed)
Pt has remained alert and oriented x4 with no c/o pain. Pt has remained on RA, SpO2 WNL, lung sounds clear to auscultation. NDN.  Pt has remained sinus brady on cardiac monitor, BP soft, HR upper 50s/lower 60s.  Pt transported to [facility] for stress test-results are negative-pt with orders for discharge with instructions to follow-up with cardio in 1 wk.  Discharge meds reviewed with Dr Katheren Shams. Per Dr Simeon Craft Metoprolol 25mg . Pt will not be given Metoprolol prescription.

## 2018-06-05 NOTE — Plan of Care (Signed)
  Problem: Pain Managment: Goal: General experience of comfort will improve Outcome: Progressing   Problem: Safety: Goal: Ability to remain free from injury will improve Outcome: Progressing   

## 2018-06-05 NOTE — Consult Note (Signed)
[facility] Cardiology  CARDIOLOGY CONSULT NOTE  Patient ID: Carlos Li MRN: [id number] DOB/AGE: 1955/12/15 63 y.o.  Admit date: 06/04/2018 Referring Physician Dr. Emmit Pomfret Primary Physician None Primary Cardiologist: None Reason for Consultation Acute chest pain  HPI:  Carlos Li is a 63 y.o. male with known tobacco and cocaine use, who presented to the ED yesterday with two days of intermittent left sided pressure-like chest pain. Pain first begun while he was at rest. It continued to occur intermittently, without obvious triggers for the next two days, increasing in severity. At its worst, he rated the pain 9/10. He denies any obvious aggravating factors. Only alleviating factor was improvement after taking 2 aspirin at home. Pain was not exertional and he noticed no change with walking (contrary to earlier report). Pain radiated down his left arm and was associated with a flushed feeling, however no nausea, vomiting, shortness of breath, or diaphoresis.   Reports history of similar chest pain one month ago when he was donating plasma. He felt a sudden tightness in his chest, followed by lightheadedness and diaphoresis. No associated palpitations or syncope. He did not seek medical attention at that time.   He states he remains active for work. Reports being able to walk several miles before becoming tired. He denies any recent dyspnea on exertion. No changes to baseline exercise tolerance.   ER evaluation included CXR, and serial troponins, all of which were within normal limits. Also had CT PE which revealed incidental pulmonary nodules but no evidence of PE. Patient has not had routine medical care in over 20 years. Smokes 1/3 pack of cigarettes per day. Drinks one 40 ounce beer daily. Occasional cocaine use with last use 2-3 weeks ago.   Review of systems complete and found to be negative unless listed above   History reviewed. No pertinent past medical history.  History reviewed. No  pertinent surgical history.  No medications prior to admission.   Social History   Socioeconomic History  . Marital status: Single    Spouse name: Not on file  . Number of children: Not on file  . Years of education: Not on file  . Highest education level: Not on file  Occupational History  . Not on file  Social Needs  . Financial resource strain: Not on file  . Food insecurity:    Worry: Not on file    Inability: Not on file  . Transportation needs:    Medical: Not on file    Non-medical: Not on file  Tobacco Use  . Smoking status: Current Every Day Smoker    Packs/day: 0.25    Types: Cigarettes  . Smokeless tobacco: Never Used  Substance and Sexual Activity  . Alcohol use: Yes    Comment: "i drink 2 40's a day"  . Drug use: Not Currently  . Sexual activity: Not on file  Lifestyle  . Physical activity:    Days per week: Not on file    Minutes per session: Not on file  . Stress: Not on file  Relationships  . Social connections:    Talks on phone: Not on file    Gets together: Not on file    Attends religious service: Not on file    Active member of club or organization: Not on file    Attends meetings of clubs or organizations: Not on file    Relationship status: Not on file  . Intimate partner violence:    Fear of current or ex partner: Not on  file    Emotionally abused: Not on file    Physically abused: Not on file    Forced sexual activity: Not on file  Other Topics Concern  . Not on file  Social History Narrative  . Not on file    History reviewed. No pertinent family history.    Review of systems complete and found to be negative unless listed above    PHYSICAL EXAM  General: Thin. Well developed. In no acute distress. No reported chest pain at this time.  HEENT:  Poor dentition. Normocephalic and atramatic. Pupils equal, round, and reactive to light.  Neck:  No JVD.  Lungs: Clear bilaterally to auscultation and percussion. Heart: HRRR . Quiet  but otherwise normal S1 and S2. No gallops or murmurs.  Abdomen: Abdomen soft and non-tender  Msk: Normal tone for age. Extremities: No clubbing, cyanosis or edema.   Neuro: Alert and oriented X 3. Psych:  Flat affect, however responds appropriately  Labs:   Lab Results  Component Value Date   WBC 9.1 06/04/2018   HGB 17.4 (H) 06/04/2018   HCT 51.3 06/04/2018   MCV 94.6 06/04/2018   PLT 305 06/04/2018    Recent Labs  Lab 06/04/18 1612  NA 136  K 4.0  CL 108  CO2 21*  BUN 13  CREATININE 1.18  CALCIUM 8.9  GLUCOSE 204*   Lab Results  Component Value Date   TROPONINI <0.03 06/05/2018    Lab Results  Component Value Date   CHOL 151 06/05/2018   Lab Results  Component Value Date   HDL 49 06/05/2018   Lab Results  Component Value Date   LDLCALC 61 06/05/2018   Lab Results  Component Value Date   TRIG 206 (H) 06/05/2018   Lab Results  Component Value Date   CHOLHDL 3.1 06/05/2018   No results found for: LDLDIRECT    Radiology: Dg Chest 2 View  Result Date: 06/04/2018 CLINICAL DATA:  Chest pain for 2 days. EXAM: CHEST - 2 VIEW COMPARISON:  None. FINDINGS: Peribronchial thickening is identified. Mild flattening of the hemidiaphragms and enlargement of the retrosternal air space are seen. Lungs are clear. Heart size is normal. No pneumothorax or pleural effusion. No acute or focal bony abnormality. IMPRESSION: No acute disease. Findings compatible with COPD. Electronically Signed   By: Drusilla Kanner M.D.   On: 06/04/2018 17:18   Ct Angio Chest Pe W And/or Wo Contrast  Result Date: 06/04/2018 CLINICAL DATA:  Throbbing chest pain radiating to neck since yesterday. EXAM: CT ANGIOGRAPHY CHEST WITH CONTRAST TECHNIQUE: Multidetector CT imaging of the chest was performed using the standard protocol during bolus administration of intravenous contrast. Multiplanar CT image reconstructions and MIPs were obtained to evaluate the vascular anatomy. CONTRAST:  75mL OMNIPAQUE  IOHEXOL 350 MG/ML SOLN COMPARISON:  Chest radiograph 06/04/2018 FINDINGS: CARDIOVASCULAR: Adequate contrast opacification of the pulmonary artery's. Main pulmonary artery is not enlarged. No pulmonary arterial filling defects to the level of the subsegmental branches. Heart size is normal. Trace coronary artery calcification. No pericardial effusion. Thoracic aorta is normal course and caliber, unremarkable. MEDIASTINUM/NODES: No lymphadenopathy by CT size criteria. Small calcified LEFT hilar lymph nodes. LUNGS/PLEURA: Tracheobronchial tree is patent, no pneumothorax. Mild bronchial wall thickening. 7 mm RIGHT upper lobe subsolid pulmonary nodule (series 6, image 46), 5 mm LEFT lower lobe ground-glass nodule (series 6, image 48). Mild mosaic lung attenuation. UPPER ABDOMEN: Non-acute.  Punctate calcified splenic granuloma. MUSCULOSKELETAL: Non-acute. Mildly thickened and sclerotic RIGHT posterior eighth rib  suggesting Paget's disease. No destructive bony lesions. Review of the MIP images confirms the above findings. IMPRESSION: 1. No acute pulmonary embolism. 2. Bronchial wall thickening seen with bronchitis or reactive airway disease. CT findings of small airway disease. No pneumonia. 3. **An incidental finding of potential clinical significance has been found. Multiple pulmonary nodules measuring to 7 mm. Non-contrast chest CT at 3-6 months is recommended. If nodules persist, subsequent management will be based upon the most suspicious nodule(s). This recommendation follows the consensus statement: Guidelines for Management of Incidental Pulmonary Nodules Detected on CT Images: From the Fleischner Society 2017; Radiology 2017; 284:228-243.** Electronically Signed   By: Awilda Metroourtnay  Bloomer M.D.   On: 06/04/2018 19:15    EKG: Sinus tachycardia with rate of 102. No evidence of ischemia.   ASSESSMENT AND PLAN:   1. Acute chest pain, ACS rule out:  Patient has no known cardiac history. ACS risk factors include  smoking history. Troponin negative x3. Patient remains asymptomatic at this time. If able to get patient in for stress today, will complete as inpatient, otherwise he is likely safe for discharge with outpatient stress test. Will follow up in clinic.   Patient was seen with Dr. Darrold JunkerParaschos. Dr. Darrold JunkerParaschos personally saw and evaluated the patient. The plan was made in conjunction and with the recommendations of Dr. Darrold JunkerParaschos.   Signed: Harrell GaveMichelle Oluwadarasimi Favor PA-C 06/05/2018, 9:14 AM

## 2018-06-06 LAB — HIV ANTIBODY (ROUTINE TESTING W REFLEX): HIV Screen 4th Generation wRfx: NONREACTIVE

## 2018-08-24 ENCOUNTER — Other Ambulatory Visit: Payer: Self-pay

## 2018-08-24 ENCOUNTER — Emergency Department
Admission: EM | Admit: 2018-08-24 | Discharge: 2018-08-24 | Disposition: A | Discharge status: Home / Self Care | Payer: PRIVATE HEALTH INSURANCE | Attending: Student in an Organized Health Care Education/Training Program | Admitting: Student in an Organized Health Care Education/Training Program

## 2018-08-24 ENCOUNTER — Emergency Department: Payer: PRIVATE HEALTH INSURANCE

## 2018-08-24 DIAGNOSIS — F17210 Nicotine dependence, cigarettes, uncomplicated: Secondary | ICD-10-CM | POA: Diagnosis not present

## 2018-08-24 DIAGNOSIS — R079 Chest pain, unspecified: Secondary | ICD-10-CM

## 2018-08-24 LAB — CBC WITH DIFFERENTIAL/PLATELET
Abs Immature Granulocytes: 0.02 10*3/uL (ref 0.00–0.07)
Basophils Absolute: 0.1 10*3/uL (ref 0.0–0.1)
Basophils Relative: 1 %
Eosinophils Absolute: 0.2 10*3/uL (ref 0.0–0.5)
Eosinophils Relative: 2 %
HCT: 46.8 % (ref 39.0–52.0)
Hemoglobin: 15.8 g/dL (ref 13.0–17.0)
Immature Granulocytes: 0 %
Lymphocytes Relative: 24 %
Lymphs Abs: 1.7 10*3/uL (ref 0.7–4.0)
MCH: 32.4 pg (ref 26.0–34.0)
MCHC: 33.8 g/dL (ref 30.0–36.0)
MCV: 96.1 fL (ref 80.0–100.0)
Monocytes Absolute: 0.7 10*3/uL (ref 0.1–1.0)
Monocytes Relative: 10 %
Neutro Abs: 4.6 10*3/uL (ref 1.7–7.7)
Neutrophils Relative %: 63 %
Platelets: 299 10*3/uL (ref 150–400)
RBC: 4.87 MIL/uL (ref 4.22–5.81)
RDW: 12.4 % (ref 11.5–15.5)
WBC: 7.3 10*3/uL (ref 4.0–10.5)
nRBC: 0 % (ref 0.0–0.2)

## 2018-08-24 LAB — COMPREHENSIVE METABOLIC PANEL
ALT: 14 U/L (ref 0–44)
AST: 28 U/L (ref 15–41)
Albumin: 3.8 g/dL (ref 3.5–5.0)
Alkaline Phosphatase: 74 U/L (ref 38–126)
Anion gap: 13 (ref 5–15)
BUN: 10 mg/dL (ref 8–23)
CO2: 20 mmol/L — ABNORMAL LOW (ref 22–32)
Calcium: 8.8 mg/dL — ABNORMAL LOW (ref 8.9–10.3)
Chloride: 106 mmol/L (ref 98–111)
Creatinine, Ser: 1.13 mg/dL (ref 0.61–1.24)
GFR calc Af Amer: >60 mL/min (ref >60)
GFR calc non Af Amer: >60 mL/min (ref >60)
Glucose, Bld: 134 mg/dL — ABNORMAL HIGH (ref 70–99)
Potassium: 3.7 mmol/L (ref 3.5–5.1)
Sodium: 139 mmol/L (ref 135–145)
Total Bilirubin: 0.7 mg/dL (ref 0.3–1.2)
Total Protein: 7.4 g/dL (ref 6.5–8.1)

## 2018-08-24 LAB — TROPONIN I
Troponin I: <0.03 ng/mL (ref <0.03)
Troponin I: <0.03 ng/mL (ref <0.03)

## 2018-08-24 MED ORDER — ALBUTEROL SULFATE HFA 108 (90 BASE) MCG/ACT IN AERS
2.0000 | INHALATION_SPRAY | RESPIRATORY_TRACT | Status: DC
  Administered 2018-08-24: 2 via RESPIRATORY_TRACT
  Filled 2018-08-24: qty 6.7

## 2018-08-24 MED ORDER — ASPIRIN 81 MG PO CHEW
324.0000 mg | CHEWABLE_TABLET | Freq: Once | ORAL | Status: AC
  Administered 2018-08-24: 324 mg via ORAL
  Filled 2018-08-24: qty 4

## 2018-08-24 MED ORDER — DEXAMETHASONE 4 MG PO TABS
10.0000 mg | ORAL_TABLET | Freq: Once | ORAL | Status: AC
  Administered 2018-08-24: 16:00:00 10 mg via ORAL
  Filled 2018-08-24: qty 2.5

## 2018-08-24 MED ORDER — HYDROCODONE-ACETAMINOPHEN 5-325 MG PO TABS
1.0000 | ORAL_TABLET | Freq: Once | ORAL | Status: AC
  Administered 2018-08-24: 16:00:00 1 via ORAL
  Filled 2018-08-24: qty 1

## 2018-08-24 NOTE — ED Triage Notes (Signed)
Pt c/o intermittent central chest pain that radiates into BL arms for the past month, states he just couldn't take it today. Pt is in NAD on arrival. Ambulatory to triage without difficulty.

## 2018-08-24 NOTE — ED Provider Notes (Signed)
[facility] Emergency Department Provider Note    First MD Initiated Contact with Patient 08/24/18 1414     (approximate)  I have reviewed the triage vital signs and the nursing notes.   HISTORY  Chief Complaint Chest Pain    HPI Carlos Li is a 63 y.o. male with 1 pack/day smoking history presents the ER for over 1 month of daily shortness of breath and chest discomfort.  Presents the ER because he ", just cannot take anymore.  "Denies any fevers.  States that shortness of breath is worse with exertion.  States this feels similar to last time he was admitted to the hospital in January.  During that stay he ruled out for MI with reassuring echo and stress test.  States he does not take inhalers.  States he does occasionally feel himself wheezing.  Does not wear home oxygen.  Denies any nausea or vomiting.  No measured fevers at home.    History reviewed. No pertinent past medical history. No family history on file. History reviewed. No pertinent surgical history. Patient Active Problem List   Diagnosis Date Noted  . Chest pain, rule out acute myocardial infarction 06/04/2018      Prior to Admission medications   Not on File    Allergies Patient has no known allergies.    Social History Social History   Tobacco Use  . Smoking status: Current Every Day Smoker    Packs/day: 0.25    Types: Cigarettes  . Smokeless tobacco: Never Used  Substance Use Topics  . Alcohol use: Yes    Comment: "i drink 2 40's a day"  . Drug use: Not Currently    Review of Systems Patient denies headaches, rhinorrhea, blurry vision, numbness, shortness of breath, chest pain, edema, cough, abdominal pain, nausea, vomiting, diarrhea, dysuria, fevers, rashes or hallucinations unless otherwise stated above in HPI. ____________________________________________   PHYSICAL EXAM:  VITAL SIGNS: Vitals:   08/24/18 1417  BP: 131/80  Pulse: 87  Resp: 18  Temp: 98.4  F (36.9 C)  SpO2: 98%    Constitutional: Alert and oriented.  Eyes: Conjunctivae are normal.  Head: Atraumatic. Nose: No congestion/rhinnorhea. Mouth/Throat: Mucous membranes are moist.   Neck: No stridor. Painless ROM.  Cardiovascular: Normal rate, regular rhythm. Grossly normal heart sounds.  Good peripheral circulation. Respiratory: Normal respiratory effort.  No retractions. Lungs CTAB. Gastrointestinal: Soft and nontender. No distention. No abdominal bruits. No CVA tenderness. Genitourinary:  Musculoskeletal: No lower extremity tenderness nor edema.  No joint effusions. Neurologic:  Normal speech and language. No gross focal neurologic deficits are appreciated. No facial droop Skin:  Skin is warm, dry and intact. No rash noted. Psychiatric: Mood and affect are normal. Speech and behavior are normal.  ____________________________________________   LABS (all labs ordered are listed, but only abnormal results are displayed)  Results for orders placed or performed during the hospital encounter of 08/24/18 (from the past 24 hour(s))  CBC with Differential/Platelet     Status: None   Collection Time: 08/24/18  2:31 PM  Result Value Ref Range   WBC 7.3 4.0 - 10.5 K/uL   RBC 4.87 4.22 - 5.81 MIL/uL   Hemoglobin 15.8 13.0 - 17.0 g/dL   HCT 14.746.8 82.939.0 - 56.252.0 %   MCV 96.1 80.0 - 100.0 fL   MCH 32.4 26.0 - 34.0 pg   MCHC 33.8 30.0 - 36.0 g/dL   RDW 13.012.4 86.511.5 - 78.415.5 %   Platelets 299 150 - 400  K/uL   nRBC 0.0 0.0 - 0.2 %   Neutrophils Relative % 63 %   Neutro Abs 4.6 1.7 - 7.7 K/uL   Lymphocytes Relative 24 %   Lymphs Abs 1.7 0.7 - 4.0 K/uL   Monocytes Relative 10 %   Monocytes Absolute 0.7 0.1 - 1.0 K/uL   Eosinophils Relative 2 %   Eosinophils Absolute 0.2 0.0 - 0.5 K/uL   Basophils Relative 1 %   Basophils Absolute 0.1 0.0 - 0.1 K/uL   Immature Granulocytes 0 %   Abs Immature Granulocytes 0.02 0.00 - 0.07 K/uL  Comprehensive metabolic panel     Status: Abnormal    Collection Time: 08/24/18  2:31 PM  Result Value Ref Range   Sodium 139 135 - 145 mmol/L   Potassium 3.7 3.5 - 5.1 mmol/L   Chloride 106 98 - 111 mmol/L   CO2 20 (L) 22 - 32 mmol/L   Glucose, Bld 134 (H) 70 - 99 mg/dL   BUN 10 8 - 23 mg/dL   Creatinine, Ser 0.62 0.61 - 1.24 mg/dL   Calcium 8.8 (L) 8.9 - 10.3 mg/dL   Total Protein 7.4 6.5 - 8.1 g/dL   Albumin 3.8 3.5 - 5.0 g/dL   AST 28 15 - 41 U/L   ALT 14 0 - 44 U/L   Alkaline Phosphatase 74 38 - 126 U/L   Total Bilirubin 0.7 0.3 - 1.2 mg/dL   GFR calc non Af Amer >60 >60 mL/min   GFR calc Af Amer >60 >60 mL/min   Anion gap 13 5 - 15  Troponin I - ONCE - STAT     Status: None   Collection Time: 08/24/18  2:31 PM  Result Value Ref Range   Troponin I <0.03 <0.03 ng/mL   ____________________________________________  EKG My review and personal interpretation at Time: 14:12   Indication: chest pain  Rate: 90  Rhythm: sinus Axis: normal Other: normal intervals, no stemi ____________________________________________  RADIOLOGY  I personally reviewed all radiographic images ordered to evaluate for the above acute complaints and reviewed radiology reports and findings.  These findings were personally discussed with the patient.  Please see medical record for radiology report.  ____________________________________________   PROCEDURES  Procedure(s) performed:  Procedures    Critical Care performed: no ____________________________________________   INITIAL IMPRESSION / ASSESSMENT AND PLAN / ED COURSE  Pertinent labs & imaging results that were available during my care of the patient were reviewed by me and considered in my medical decision making (see chart for details).   DDX: ACS, pericarditis, esophagitis, boerhaaves, pe, dissection, pna, bronchitis, costochondritis   Carlos Li is a 63 y.o. who presents to the ED with 1 month of  persistent chest pain.  Patient is AFVSS in ED. Exam as above. Given current  presentation have considered the above differential.  Patient is well-appearing.  EKG shows no evidence of acute ischemia.  Troponin is negative has had symptoms for over 1 month.  Doubt ACS.  Does have some wheezing on exam.  I have a high suspicion for bronchitis.  Doubt PE the lack of leg swelling, risk factors, tachycardia or hypoxia.  Given recent negative stress test will give referral to cardiology but will start on albuterol inhaler.  He does not have any signs or symptoms of COVID-19 and does not meet criteria for testing per [facility] algorithm.    Heart score of 3  Will repeat trop.  If negative stable for DC home.Have discussed with  the patient and available family all diagnostics and treatments performed thus far and all questions were answered to the best of my ability. The patient demonstrates understanding and agreement with plan.       The patient was evaluated in Emergency Department today for the symptoms described in the history of present illness. He/she was evaluated in the context of the global COVID-19 pandemic, which necessitated consideration that the patient might be at risk for infection with the SARS-CoV-2 virus that causes COVID-19. Institutional protocols and algorithms that pertain to the evaluation of patients at risk for COVID-19 are in a state of rapid change based on information released by regulatory bodies including the CDC and federal and state organizations. These policies and algorithms were followed during the patient's care in the ED.   As part of my medical decision making, I reviewed the following data within the electronic MEDICAL RECORD NUMBER Nursing notes reviewed and incorporated, Labs reviewed, notes from prior ED visits and [place] Controlled Substance Database   ____________________________________________   FINAL CLINICAL IMPRESSION(S) / ED DIAGNOSES  Final diagnoses:  Chest pain, unspecified type      NEW MEDICATIONS STARTED DURING THIS VISIT:   New Prescriptions   No medications on file     Note:  This document was prepared using Dragon voice recognition software and may include unintentional dictation errors.    Willy Eddy, MD 08/24/18 1534

## 2018-08-24 NOTE — ED Notes (Signed)
Pt given meal tray at this time 

## 2018-08-24 NOTE — ED Provider Notes (Signed)
-----------------------------------------   6:06 PM on 08/24/2018 -----------------------------------------  Patient's repeat troponin remains negative.  Patient appears very well on my examination.  We will discharge.  Discussed my normal chest pain return precautions.   Minna Antis, MD 08/24/18 1806

## 2018-12-31 ENCOUNTER — Emergency Department
Admission: EM | Admit: 2018-12-31 | Discharge: 2018-12-31 | Disposition: A | Discharge status: Home / Self Care | Payer: PRIVATE HEALTH INSURANCE | Attending: Emergency Medicine | Admitting: Emergency Medicine

## 2018-12-31 ENCOUNTER — Other Ambulatory Visit: Payer: Self-pay

## 2018-12-31 ENCOUNTER — Emergency Department: Payer: PRIVATE HEALTH INSURANCE

## 2018-12-31 ENCOUNTER — Encounter: Payer: Self-pay | Admitting: Emergency Medicine

## 2018-12-31 DIAGNOSIS — M25561 Pain in right knee: Secondary | ICD-10-CM | POA: Insufficient documentation

## 2018-12-31 DIAGNOSIS — Y92480 Sidewalk as the place of occurrence of the external cause: Secondary | ICD-10-CM | POA: Diagnosis not present

## 2018-12-31 DIAGNOSIS — M549 Dorsalgia, unspecified: Secondary | ICD-10-CM | POA: Diagnosis present

## 2018-12-31 DIAGNOSIS — Y999 Unspecified external cause status: Secondary | ICD-10-CM | POA: Diagnosis not present

## 2018-12-31 DIAGNOSIS — M25512 Pain in left shoulder: Secondary | ICD-10-CM | POA: Diagnosis not present

## 2018-12-31 DIAGNOSIS — F17210 Nicotine dependence, cigarettes, uncomplicated: Secondary | ICD-10-CM | POA: Insufficient documentation

## 2018-12-31 DIAGNOSIS — M7918 Myalgia, other site: Secondary | ICD-10-CM | POA: Diagnosis not present

## 2018-12-31 DIAGNOSIS — Y9355 Activity, bike riding: Secondary | ICD-10-CM | POA: Insufficient documentation

## 2018-12-31 MED ORDER — IBUPROFEN 600 MG PO TABS
600.0000 mg | ORAL_TABLET | Freq: Once | ORAL | Status: AC
  Administered 2018-12-31: 600 mg via ORAL
  Filled 2018-12-31: qty 1

## 2018-12-31 MED ORDER — IBUPROFEN 600 MG PO TABS: 600.0000 mg | ORAL_TABLET | Freq: Three times a day (TID) | ORAL | 0 refills | Status: AC | PRN

## 2018-12-31 MED ORDER — CYCLOBENZAPRINE HCL 10 MG PO TABS
10.0000 mg | ORAL_TABLET | Freq: Once | ORAL | Status: AC
  Administered 2018-12-31: 10 mg via ORAL
  Filled 2018-12-31: qty 1

## 2018-12-31 MED ORDER — CYCLOBENZAPRINE HCL 10 MG PO TABS: 10.0000 mg | ORAL_TABLET | Freq: Three times a day (TID) | ORAL | 0 refills | Status: DC | PRN
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
## 2018-12-31 NOTE — ED Notes (Signed)
Pt verbalized understanding of discharge instructions and states has ride home. Pt is NAD at this time.

## 2019-02-02 DIAGNOSIS — R053 Chronic cough: Secondary | ICD-10-CM | POA: Insufficient documentation

## 2019-03-03 ENCOUNTER — Other Ambulatory Visit: Payer: Self-pay | Admitting: Infectious Diseases

## 2019-03-03 DIAGNOSIS — R634 Abnormal weight loss: Secondary | ICD-10-CM

## 2019-03-03 DIAGNOSIS — R05 Cough: Secondary | ICD-10-CM

## 2019-03-03 DIAGNOSIS — M25551 Pain in right hip: Secondary | ICD-10-CM

## 2019-03-03 DIAGNOSIS — R053 Chronic cough: Secondary | ICD-10-CM

## 2019-03-03 DIAGNOSIS — G8929 Other chronic pain: Secondary | ICD-10-CM

## 2019-03-15 ENCOUNTER — Ambulatory Visit
Admission: RE | Admit: 2019-03-15 | Discharge: 2019-03-15 | Disposition: A | Discharge status: Home / Self Care | Payer: PRIVATE HEALTH INSURANCE | Source: Ambulatory Visit | Attending: Infectious Diseases | Admitting: Infectious Diseases

## 2019-03-15 ENCOUNTER — Other Ambulatory Visit: Payer: Self-pay

## 2019-03-15 DIAGNOSIS — R05 Cough: Secondary | ICD-10-CM | POA: Insufficient documentation

## 2019-03-15 DIAGNOSIS — R634 Abnormal weight loss: Secondary | ICD-10-CM | POA: Insufficient documentation

## 2019-03-15 DIAGNOSIS — R053 Chronic cough: Secondary | ICD-10-CM

## 2019-03-15 DIAGNOSIS — M25551 Pain in right hip: Secondary | ICD-10-CM | POA: Insufficient documentation

## 2019-03-15 DIAGNOSIS — G8929 Other chronic pain: Secondary | ICD-10-CM | POA: Insufficient documentation

## 2020-05-05 ENCOUNTER — Emergency Department
Admission: EM | Admit: 2020-05-05 | Discharge: 2020-05-05 | Disposition: A | Discharge status: Home / Self Care | Payer: PRIVATE HEALTH INSURANCE | Attending: Emergency Medicine | Admitting: Emergency Medicine

## 2020-05-05 ENCOUNTER — Other Ambulatory Visit: Payer: Self-pay

## 2020-05-05 DIAGNOSIS — K0889 Other specified disorders of teeth and supporting structures: Secondary | ICD-10-CM | POA: Diagnosis present

## 2020-05-05 DIAGNOSIS — F17210 Nicotine dependence, cigarettes, uncomplicated: Secondary | ICD-10-CM | POA: Insufficient documentation

## 2020-05-05 DIAGNOSIS — K047 Periapical abscess without sinus: Secondary | ICD-10-CM | POA: Diagnosis not present

## 2020-05-05 MED ORDER — AMOXICILLIN 500 MG PO CAPS: 500.0000 mg | ORAL_CAPSULE | Freq: Three times a day (TID) | ORAL | 0 refills | Status: DC

## 2020-05-05 MED ORDER — TRAMADOL HCL 50 MG PO TABS: 50.0000 mg | ORAL_TABLET | Freq: Two times a day (BID) | ORAL | 0 refills | Status: AC

## 2020-05-05 MED ORDER — TRAMADOL HCL 50 MG PO TABS
50.0000 mg | ORAL_TABLET | Freq: Once | ORAL | Status: AC
  Administered 2020-05-05: 50 mg via ORAL
  Filled 2020-05-05: qty 1

## 2020-05-05 MED ORDER — AMOXICILLIN 500 MG PO CAPS
500.0000 mg | ORAL_CAPSULE | Freq: Once | ORAL | Status: AC
  Administered 2020-05-05: 500 mg via ORAL
  Filled 2020-05-05: qty 1

## 2020-05-05 MED ORDER — LIDOCAINE VISCOUS HCL 2 % MT SOLN
15.0000 mL | Freq: Once | OROMUCOSAL | Status: AC
  Administered 2020-05-05: 15 mL via OROMUCOSAL
  Filled 2020-05-05: qty 15

## 2020-05-05 NOTE — ED Provider Notes (Signed)
[facility] Emergency Department Provider Note ____________________________________________  Time seen: 1645  I have reviewed the triage vital signs and the nursing notes.  HISTORY  Chief Complaint  Dental Pain   HPI Carlos Li is a 65 y.o. male presents himself to the ED for evaluation of an injury to the left lower gumline.  Patient is primarily edentulous to his primary and secondary premolars of the lower jaw.  His front incisors are present.  Patient presents with concern over local area of skin irritation to the left lower jaw.  He denies any purulent drainage, denies any fever, chills, or sweats.  Reports tenderness to the area and difficulty with chewing and eating.   History reviewed. No pertinent past medical history.  Patient Active Problem List   Diagnosis Date Noted  . Chest pain, rule out acute myocardial infarction 06/04/2018    History reviewed. No pertinent surgical history.  Prior to Admission medications   Medication Sig Start Date End Date Taking? Authorizing Provider  amoxicillin (AMOXIL) 500 MG capsule Take 1 capsule (500 mg total) by mouth 3 (three) times daily. 05/05/20  Yes Emylia Latella, Charlesetta Ivory, PA-C  traMADol (ULTRAM) 50 MG tablet Take 1 tablet (50 mg total) by mouth 2 (two) times daily for 5 days. 05/05/20 05/10/20 Yes Antonella Upson, Charlesetta Ivory, PA-C  cyclobenzaprine (FLEXERIL) 10 MG tablet Take 1 tablet (10 mg total) by mouth 3 (three) times daily as needed. 12/31/18   Joni Reining, PA-C  ibuprofen (ADVIL) 600 MG tablet Take 1 tablet (600 mg total) by mouth every 8 (eight) hours as needed. 12/31/18   Joni Reining, PA-C    Allergies Patient has no known allergies.  History reviewed. No pertinent family history.  Social History Social History   Tobacco Use  . Smoking status: Current Every Day Smoker    Packs/day: 0.25    Types: Cigarettes  . Smokeless tobacco: Never Used  Vaping Use  . Vaping Use: Never used  Substance  Use Topics  . Alcohol use: Yes    Comment: "i drink 2 40's a day"  . Drug use: Not Currently    Review of Systems  Constitutional: Negative for fever. Eyes: Negative for visual changes. ENT: Negative for sore throat.  Gumline pain and swelling as above. Cardiovascular: Negative for chest pain. Respiratory: Negative for shortness of breath. Gastrointestinal: Negative for abdominal pain, vomiting and diarrhea. Genitourinary: Negative for dysuria. Musculoskeletal: Negative for back pain. Skin: Negative for rash. Neurological: Negative for headaches, focal weakness or numbness. ____________________________________________  PHYSICAL EXAM:  VITAL SIGNS: ED Triage Vitals  Enc Vitals Group     BP 05/05/20 1334 (!) 159/103     Pulse Rate 05/05/20 1334 (!) 102     Resp 05/05/20 1334 18     Temp 05/05/20 1334 99.2 F (37.3 C)     Temp Source 05/05/20 1334 Oral     SpO2 05/05/20 1334 97 %     Weight 05/05/20 1333 150 lb (68 kg)     Height 05/05/20 1333 5\' 7"  (1.702 m)     Head Circumference --      Peak Flow --      Pain Score 05/05/20 1333 10     Pain Loc --      Pain Edu? --      Excl. in [place]? --     Constitutional: Alert and oriented. Well appearing and in no distress. Head: Normocephalic and atraumatic. Eyes: Conjunctivae are normal. PERRL. Normal extraocular  movements Ears: Canals clear. TMs intact bilaterally. Nose: No congestion/rhinorrhea/epistaxis. Mouth/Throat: Mucous membranes are moist.  Uvula is midline and tonsils are flat.  Patient with edentulous gumline related to the pre and secondary molars bilaterally.  There is a focal area of gum irritation noted to the left lower jaw where the premolar would be located.  It is unclear whether this represents a broken and exposed dental root, or local soft tissue infection.  There is without fluctuance, pointing, or spontaneous drainage.  No brawny sublingual edema is appreciated.   Neck: Supple. No  thyromegaly. Hematological/Lymphatic/Immunological: No cervical lymphadenopathy. Cardiovascular: Normal rate, regular rhythm. Normal distal pulses. Respiratory: Normal respiratory effort. No wheezes/rales/rhonchi. Musculoskeletal: Nontender with normal range of motion in all extremities.  Neurologic:  Normal gait without ataxia. Normal speech and language. No gross focal neurologic deficits are appreciated. Skin:  Skin is warm, dry and intact. No rash noted. ____________________________________________  PROCEDURES  Amoxicillin 500 mg p.o. Tramadol 50 mg p.o. Viscous lidocaine 2% p.o.  Procedures ____________________________________________  INITIAL IMPRESSION / ASSESSMENT AND PLAN / ED COURSE  Patient ED evaluation of gumline pain.  Patient presents with area of concern of tenderness to the left lower jaw.  Patient will be treated empirically for a dental abscess.  A prescription for amoxicillin as well as tramadol provided was benefit.  Follow-up with a local dental provider for definitive management.  Return precautions have been discussed.  Carlos Li was evaluated in Emergency Department on 05/05/2020 for the symptoms described in the history of present illness. He was evaluated in the context of the global COVID-19 pandemic, which necessitated consideration that the patient might be at risk for infection with the SARS-CoV-2 virus that causes COVID-19. Institutional protocols and algorithms that pertain to the evaluation of patients at risk for COVID-19 are in a state of rapid change based on information released by regulatory bodies including the CDC and federal and state organizations. These policies and algorithms were followed during the patient's care in the ED.  I reviewed the patient's prescription history over the last 12 months in the multi-state controlled substances database(s) that includes [place], [place], [place], [place], [place], [place], [place], [place],  [place], [place], [place], [place], [place], and [place].  Results were notable for no RX. ____________________________________________  FINAL CLINICAL IMPRESSION(S) / ED DIAGNOSES  Final diagnoses:  Dental infection      Karmen Stabs, Charlesetta Ivory, PA-C 05/05/20 2019    Gilles Chiquito, MD 05/05/20 2342

## 2020-05-05 NOTE — Discharge Instructions (Addendum)
Take the antibiotics as prescribed.  Take the pain medicine as needed.  Rinse mouth with warm salt water to help reduce pain and inflammation.  Follow-up with Carlos Li for ongoing medical care.

## 2020-05-05 NOTE — ED Triage Notes (Signed)
Pt has most of teeth removed on bottom and recently got a cut on his lower left gums. Small, swollen area that pt states is very painful near cut. No redness or bleeding.

## 2020-07-02 ENCOUNTER — Emergency Department
Admission: EM | Admit: 2020-07-02 | Discharge: 2020-07-02 | Disposition: A | Discharge status: Home / Self Care | Payer: PRIVATE HEALTH INSURANCE | Attending: Emergency Medicine | Admitting: Emergency Medicine

## 2020-07-02 ENCOUNTER — Emergency Department: Payer: PRIVATE HEALTH INSURANCE

## 2020-07-02 ENCOUNTER — Other Ambulatory Visit: Payer: Self-pay

## 2020-07-02 DIAGNOSIS — M7918 Myalgia, other site: Secondary | ICD-10-CM

## 2020-07-02 DIAGNOSIS — Y92410 Unspecified street and highway as the place of occurrence of the external cause: Secondary | ICD-10-CM | POA: Insufficient documentation

## 2020-07-02 DIAGNOSIS — M549 Dorsalgia, unspecified: Secondary | ICD-10-CM | POA: Insufficient documentation

## 2020-07-02 DIAGNOSIS — M542 Cervicalgia: Secondary | ICD-10-CM | POA: Diagnosis not present

## 2020-07-02 DIAGNOSIS — M791 Myalgia, unspecified site: Secondary | ICD-10-CM | POA: Diagnosis not present

## 2020-07-02 DIAGNOSIS — F17210 Nicotine dependence, cigarettes, uncomplicated: Secondary | ICD-10-CM | POA: Insufficient documentation

## 2020-07-02 MED ORDER — IBUPROFEN 600 MG PO TABS
600.0000 mg | ORAL_TABLET | Freq: Once | ORAL | Status: AC
  Administered 2020-07-02: 600 mg via ORAL
  Filled 2020-07-02: qty 1

## 2020-07-02 MED ORDER — ORPHENADRINE CITRATE ER 100 MG PO TB12: 100.0000 mg | ORAL_TABLET | Freq: Two times a day (BID) | ORAL | 0 refills | Status: AC

## 2020-07-02 MED ORDER — NAPROXEN 500 MG PO TABS: 500.0000 mg | ORAL_TABLET | Freq: Two times a day (BID) | ORAL | Status: DC

## 2020-07-02 MED ORDER — TRAMADOL HCL 50 MG PO TABS
50.0000 mg | ORAL_TABLET | Freq: Once | ORAL | Status: AC
  Administered 2020-07-02: 50 mg via ORAL
  Filled 2020-07-02: qty 1

## 2020-07-02 NOTE — Discharge Instructions (Signed)
No acute findings on x-ray of the cervical and lumbar spine.
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
## 2020-07-04 ENCOUNTER — Emergency Department: Payer: PRIVATE HEALTH INSURANCE

## 2020-07-04 ENCOUNTER — Encounter: Payer: Self-pay | Admitting: Emergency Medicine

## 2020-07-04 ENCOUNTER — Emergency Department
Admission: EM | Admit: 2020-07-04 | Discharge: 2020-07-04 | Disposition: A | Discharge status: Home / Self Care | Payer: PRIVATE HEALTH INSURANCE | Attending: Emergency Medicine | Admitting: Emergency Medicine

## 2020-07-04 ENCOUNTER — Other Ambulatory Visit: Payer: Self-pay

## 2020-07-04 DIAGNOSIS — M549 Dorsalgia, unspecified: Secondary | ICD-10-CM

## 2020-07-04 DIAGNOSIS — F17210 Nicotine dependence, cigarettes, uncomplicated: Secondary | ICD-10-CM | POA: Diagnosis not present

## 2020-07-04 DIAGNOSIS — M791 Myalgia, unspecified site: Secondary | ICD-10-CM | POA: Insufficient documentation

## 2020-07-04 DIAGNOSIS — M546 Pain in thoracic spine: Secondary | ICD-10-CM | POA: Diagnosis not present

## 2020-07-04 DIAGNOSIS — M545 Low back pain, unspecified: Secondary | ICD-10-CM | POA: Diagnosis not present

## 2020-07-04 MED ORDER — CYCLOBENZAPRINE HCL 10 MG PO TABS
10.0000 mg | ORAL_TABLET | Freq: Once | ORAL | Status: AC
  Administered 2020-07-04: 10 mg via ORAL
  Filled 2020-07-04: qty 1

## 2020-07-04 MED ORDER — NAPROXEN 500 MG PO TABS
500.0000 mg | ORAL_TABLET | Freq: Once | ORAL | Status: AC
  Administered 2020-07-04: 500 mg via ORAL
  Filled 2020-07-04: qty 1

## 2020-07-04 NOTE — ED Notes (Signed)
Pt in CT.
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
## 2020-07-26 ENCOUNTER — Emergency Department: Payer: PRIVATE HEALTH INSURANCE

## 2020-07-26 ENCOUNTER — Other Ambulatory Visit: Payer: Self-pay

## 2020-07-26 ENCOUNTER — Emergency Department
Admission: EM | Admit: 2020-07-26 | Discharge: 2020-07-26 | Disposition: A | Discharge status: Home / Self Care | Payer: PRIVATE HEALTH INSURANCE | Attending: Emergency Medicine | Admitting: Emergency Medicine

## 2020-07-26 ENCOUNTER — Encounter: Payer: Self-pay | Admitting: Emergency Medicine

## 2020-07-26 DIAGNOSIS — F17210 Nicotine dependence, cigarettes, uncomplicated: Secondary | ICD-10-CM | POA: Insufficient documentation

## 2020-07-26 DIAGNOSIS — W228XXA Striking against or struck by other objects, initial encounter: Secondary | ICD-10-CM | POA: Diagnosis not present

## 2020-07-26 DIAGNOSIS — S0083XA Contusion of other part of head, initial encounter: Secondary | ICD-10-CM | POA: Diagnosis not present

## 2020-07-26 DIAGNOSIS — S0502XA Injury of conjunctiva and corneal abrasion without foreign body, left eye, initial encounter: Secondary | ICD-10-CM

## 2020-07-26 DIAGNOSIS — H1133 Conjunctival hemorrhage, bilateral: Secondary | ICD-10-CM | POA: Insufficient documentation

## 2020-07-26 DIAGNOSIS — S0501XA Injury of conjunctiva and corneal abrasion without foreign body, right eye, initial encounter: Secondary | ICD-10-CM

## 2020-07-26 DIAGNOSIS — S0993XA Unspecified injury of face, initial encounter: Secondary | ICD-10-CM | POA: Diagnosis present

## 2020-07-26 MED ORDER — TRAMADOL HCL 50 MG PO TABS: 50.0000 mg | ORAL_TABLET | Freq: Four times a day (QID) | ORAL | 0 refills | Status: AC | PRN

## 2020-07-26 MED ORDER — EYE WASH OPHTH SOLN
1.0000 [drp] | OPHTHALMIC | Status: DC | PRN
  Administered 2020-07-26: 1 [drp] via OPHTHALMIC
  Filled 2020-07-26: qty 118

## 2020-07-26 MED ORDER — NAPROXEN 500 MG PO TABS: 500.0000 mg | ORAL_TABLET | Freq: Two times a day (BID) | ORAL | 0 refills | Status: AC

## 2020-07-26 MED ORDER — FLUORESCEIN SODIUM 1 MG OP STRP
2.0000 | ORAL_STRIP | Freq: Once | OPHTHALMIC | Status: AC
  Administered 2020-07-26: 2 via OPHTHALMIC
  Filled 2020-07-26: qty 2

## 2020-07-26 MED ORDER — TRAMADOL HCL 50 MG PO TABS
50.0000 mg | ORAL_TABLET | Freq: Once | ORAL | Status: AC
  Administered 2020-07-26: 50 mg via ORAL
  Filled 2020-07-26: qty 1

## 2020-07-26 MED ORDER — GENTAMICIN SULFATE 0.3 % OP SOLN: 1.0000 [drp] | OPHTHALMIC | 0 refills | Status: AC

## 2020-07-26 MED ORDER — TETRACAINE HCL 0.5 % OP SOLN
2.0000 [drp] | Freq: Once | OPHTHALMIC | Status: AC
  Administered 2020-07-26: 2 [drp] via OPHTHALMIC
  Filled 2020-07-26: qty 4

## 2020-07-26 MED ORDER — NAPROXEN 500 MG PO TABS
500.0000 mg | ORAL_TABLET | Freq: Once | ORAL | Status: AC
  Administered 2020-07-26: 500 mg via ORAL
  Filled 2020-07-26: qty 1

## 2020-07-26 NOTE — Discharge Instructions (Signed)
Read and follow discharge care instruction. 
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
## 2020-08-14 ENCOUNTER — Telehealth: Payer: Self-pay | Admitting: *Deleted

## 2020-08-14 DIAGNOSIS — Z122 Encounter for screening for malignant neoplasm of respiratory organs: Secondary | ICD-10-CM

## 2020-08-14 DIAGNOSIS — F17200 Nicotine dependence, unspecified, uncomplicated: Secondary | ICD-10-CM

## 2020-08-14 DIAGNOSIS — Z87891 Personal history of nicotine dependence: Secondary | ICD-10-CM

## 2020-08-14 NOTE — Telephone Encounter (Signed)
Received referral for initial lung cancer screening scan. Contacted patient and obtained smoking history,(current every day smoker, 0.5 ppd x 45 yrs) as well as answering questions related to screening process. Patient denies signs of lung cancer such as weight loss or hemoptysis. Patient denies comorbidity that would prevent curative treatment if lung cancer were found. Patient is scheduled for shared decision making visit and CT scan on 09/03/2020 @ 2:00 pm.

## 2020-09-03 ENCOUNTER — Inpatient Hospital Stay: Payer: PRIVATE HEALTH INSURANCE | Admitting: Nurse Practitioner

## 2020-09-03 ENCOUNTER — Other Ambulatory Visit: Payer: Self-pay

## 2020-09-03 ENCOUNTER — Encounter: Payer: Self-pay | Admitting: Nurse Practitioner

## 2020-09-03 ENCOUNTER — Ambulatory Visit: Admission: RE | Admit: 2020-09-03 | Payer: PRIVATE HEALTH INSURANCE | Source: Ambulatory Visit

## 2020-09-07 ENCOUNTER — Ambulatory Visit
Admission: RE | Admit: 2020-09-07 | Discharge: 2020-09-07 | Disposition: A | Discharge status: Home / Self Care | Payer: PRIVATE HEALTH INSURANCE | Source: Ambulatory Visit | Attending: Oncology | Admitting: Oncology

## 2020-09-07 ENCOUNTER — Other Ambulatory Visit: Payer: Self-pay

## 2020-09-07 ENCOUNTER — Inpatient Hospital Stay: Payer: PRIVATE HEALTH INSURANCE | Attending: Oncology | Admitting: Nurse Practitioner

## 2020-09-07 DIAGNOSIS — Z87891 Personal history of nicotine dependence: Secondary | ICD-10-CM

## 2020-09-07 DIAGNOSIS — Z122 Encounter for screening for malignant neoplasm of respiratory organs: Secondary | ICD-10-CM | POA: Diagnosis present

## 2020-09-07 DIAGNOSIS — F17200 Nicotine dependence, unspecified, uncomplicated: Secondary | ICD-10-CM | POA: Insufficient documentation

## 2020-09-07 NOTE — Progress Notes (Signed)
Virtual Visit via Video Enabled Telemedicine Note   I connected with Carlos Li on 09/07/20 at 1:00 PM EST by video enabled telemedicine visit and verified that I am speaking with the correct person using two identifiers.   I discussed the limitations, risks, security and privacy concerns of performing an evaluation and management service by telemedicine and the availability of in-person appointments. I also discussed with the patient that there may be a patient responsible charge related to this service. The patient expressed understanding and agreed to proceed.   Other persons participating in the visit and their role in the encounter: Burgess Estelle, RN- checking in patient & navigation  Patient's location: [place]  Provider's location: Clinic  Chief Complaint: Low Dose CT Screening  Patient agreed to evaluation by telemedicine to discuss shared decision making for consideration of low dose CT lung cancer screening.    In accordance with CMS guidelines, patient has met eligibility criteria including age, absence of signs or symptoms of lung cancer.  Social History   Tobacco Use  . Smoking status: Current Every Day Smoker    Packs/day: 0.50    Years: 45.00    Pack years: 22.50    Types: Cigarettes  . Smokeless tobacco: Never Used  Substance Use Topics  . Alcohol use: Yes    Comment: "i drink 2 40's a day"     A shared decision-making session was conducted prior to the performance of CT scan. This includes one or more decision aids, includes benefits and harms of screening, follow-up diagnostic testing, over-diagnosis, false positive rate, and total radiation exposure.   Counseling on the importance of adherence to annual lung cancer LDCT screening, impact of co-morbidities, and ability or willingness to undergo diagnosis and treatment is imperative for compliance of the program.   Counseling on the importance of continued smoking cessation for former smokers; the  importance of smoking cessation for current smokers, and information about tobacco cessation interventions have been given to patient including [place] and [employer] [place] programs.   Written order for lung cancer screening with LDCT has been given to the patient and any and all questions have been answered to the best of my abilities.    Yearly follow up will be coordinated by Burgess Estelle, Thoracic Navigator.  I discussed the assessment and treatment plan with the patient. The patient was provided an opportunity to ask questions and all were answered. The patient agreed with the plan and demonstrated an understanding of the instructions.   The patient was advised to call back or seek an in-person evaluation if the symptoms worsen or if the condition fails to improve as anticipated.   I provided 15 minutes of face-to-face video visit time dedicated to the care of this patient on the date of this encounter to include pre-visit review of smoking history, face-to-face time with the patient, and post visit ordering of testing/documentation.   Beckey Rutter, DNP, AGNP-C [facility] [phone] (clinic)

## 2020-09-11 ENCOUNTER — Encounter: Payer: Self-pay | Admitting: *Deleted

## 2020-09-20 ENCOUNTER — Emergency Department: Payer: Worker's Compensation

## 2020-09-20 ENCOUNTER — Other Ambulatory Visit: Payer: Self-pay

## 2020-09-20 ENCOUNTER — Emergency Department
Admission: EM | Admit: 2020-09-20 | Discharge: 2020-09-20 | Disposition: A | Discharge status: Home / Self Care | Payer: Worker's Compensation | Attending: Emergency Medicine | Admitting: Emergency Medicine

## 2020-09-20 ENCOUNTER — Encounter: Payer: Self-pay | Admitting: Emergency Medicine

## 2020-09-20 DIAGNOSIS — F17210 Nicotine dependence, cigarettes, uncomplicated: Secondary | ICD-10-CM | POA: Diagnosis not present

## 2020-09-20 DIAGNOSIS — S0990XA Unspecified injury of head, initial encounter: Secondary | ICD-10-CM | POA: Diagnosis present

## 2020-09-20 DIAGNOSIS — S00211A Abrasion of right eyelid and periocular area, initial encounter: Secondary | ICD-10-CM | POA: Insufficient documentation

## 2020-09-20 DIAGNOSIS — W228XXA Striking against or struck by other objects, initial encounter: Secondary | ICD-10-CM | POA: Diagnosis not present

## 2020-09-20 DIAGNOSIS — Y990 Civilian activity done for income or pay: Secondary | ICD-10-CM | POA: Insufficient documentation

## 2020-09-20 DIAGNOSIS — T148XXA Other injury of unspecified body region, initial encounter: Secondary | ICD-10-CM

## 2020-09-20 NOTE — ED Notes (Signed)
Per Rosanne Ashing, at [place], patient will be terminated from his employer without providing a sample. Patient notified. Water provided to patient.

## 2020-09-20 NOTE — ED Notes (Signed)
Patient discharged to lobby awaiting a ride home.

## 2020-09-20 NOTE — ED Notes (Signed)
Patient able to provide urine sample at this time. Urine collected with this RN and Selena Batten, NT at bedside during urination.

## 2020-09-20 NOTE — ED Notes (Addendum)
Patient denies his employer requested any urine or blood, this RN to verify via system log for Nov[employer].

## 2020-09-20 NOTE — ED Provider Notes (Signed)
[facility] Emergency Department Provider Note  ____________________________________________   Event Date/Time   First MD Initiated Contact with Patient 09/20/20 1341     (approximate)  I have reviewed the triage vital signs and the nursing notes.   HISTORY  Chief Complaint Head Injury    HPI Carlos Li is a 65 y.o. male presents emergency department complaining of a head injury.  Patient states that he was working on a machine this morning when the metal strap broke loose and the piece of metal hit him in the head.  Some blurred vision after the incident.  No known eye injury.  No nausea or vomiting.   Tdap is up-to-date   History reviewed. No pertinent past medical history.  Patient Active Problem List   Diagnosis Date Noted  . Chronic cough 02/02/2019  . Chest pain, rule out acute myocardial infarction 06/04/2018  . Alcohol abuse 05/17/2013  . Cocaine abuse (HCC) 05/17/2013  . Major depression 05/17/2013    History reviewed. No pertinent surgical history.  Prior to Admission medications   Medication Sig Start Date End Date Taking? Authorizing Provider  gentamicin (GARAMYCIN) 0.3 % ophthalmic solution Place 1 drop into both eyes every 4 (four) hours. 07/26/20   Joni Reining, PA-C  ibuprofen (ADVIL) 600 MG tablet Take 1 tablet (600 mg total) by mouth every 8 (eight) hours as needed. 12/31/18   Joni Reining, PA-C  meloxicam (MOBIC) 7.5 MG tablet Take by mouth. 07/16/20 07/16/21  [provider]  naproxen (NAPROSYN) 500 MG tablet Take 1 tablet (500 mg total) by mouth 2 (two) times daily with a meal. 07/26/20   Joni Reining, PA-C  orphenadrine (NORFLEX) 100 MG tablet Take 1 tablet (100 mg total) by mouth 2 (two) times daily. 07/02/20   Joni Reining, PA-C  traMADol (ULTRAM) 50 MG tablet Take 1 tablet (50 mg total) by mouth every 6 (six) hours as needed for moderate pain. 07/26/20   Joni Reining, PA-C    Allergies Iodinated  diagnostic agents  No family history on file.  Social History Social History   Tobacco Use  . Smoking status: Current Every Day Smoker    Packs/day: 0.50    Years: 45.00    Pack years: 22.50    Types: Cigarettes  . Smokeless tobacco: Never Used  Vaping Use  . Vaping Use: Never used  Substance Use Topics  . Alcohol use: Yes    Comment: "i drink 2 40's a day"  . Drug use: Not Currently    Review of Systems  Constitutional: No fever/chills Eyes: Positive visual changes. ENT: No sore throat. Respiratory: Denies cough Cardiovascular: Denies chest pain Gastrointestinal: Denies abdominal pain Genitourinary: Negative for dysuria. Musculoskeletal: Negative for back pain. Skin: Negative for rash. Psychiatric: no mood changes,     ____________________________________________   PHYSICAL EXAM:  VITAL SIGNS: ED Triage Vitals  Enc Vitals Group     BP 09/20/20 1255 121/81     Pulse Rate 09/20/20 1255 62     Resp 09/20/20 1255 15     Temp 09/20/20 1255 98 F (36.7 C)     Temp Source 09/20/20 1255 Oral     SpO2 09/20/20 1255 97 %     Weight 09/20/20 1148 149 lb 14.6 oz (68 kg)     Height 09/20/20 1148 5\' 7"  (1.702 m)     Head Circumference --      Peak Flow --      Pain Score  09/20/20 1148 7     Pain Loc --      Pain Edu? --      Excl. in GC? --     Constitutional: Alert and oriented. Well appearing and in no acute distress. Eyes: Conjunctivae are normal.  Head: Swelling and abrasion noted to the right brow Nose: No congestion/rhinnorhea. Mouth/Throat: Mucous membranes are moist.   Neck:  supple no lymphadenopathy noted Cardiovascular: Normal rate, regular rhythm.  Respiratory: Normal respiratory effort.  No retractions GU: deferred Musculoskeletal: FROM all extremities, warm and well perfused Neurologic:  Normal speech and language.  Cranial nerves II through XII grossly intact Skin:  Skin is warm, dry, already healing abrasion noted no rash  noted. Psychiatric: Mood and affect are normal. Speech and behavior are normal.  ____________________________________________   LABS (all labs ordered are listed, but only abnormal results are displayed)  Labs Reviewed - No data to display ____________________________________________   ____________________________________________  RADIOLOGY  CT of the head /maxillofacial  ____________________________________________   PROCEDURES  Procedure(s) performed: No  Procedures    ____________________________________________   INITIAL IMPRESSION / ASSESSMENT AND PLAN / ED COURSE  Pertinent labs & imaging results that were available during my care of the patient were reviewed by me and considered in my medical decision making (see chart for details).   The patient 65 year old male presents with head injury.  See HPI.  Physical exam shows patient per stable  CT of the head and maxillofacial to rule out fracture   CTs were reviewed by me and confirmed by radiology to have no acute abnormality  I did explain findings to the patient.  He can take Tylenol and ibuprofen for headaches as needed.  Splane to him the wound does not need to be sutured as it is already started to heal.  He is to follow-up with his regular doctor if not improving to 3 days.  Return emergency department worsening.  Is discharged stable condition.  Carlos Li was evaluated in Emergency Department on 09/20/2020 for the symptoms described in the history of present illness. He was evaluated in the context of the global COVID-19 pandemic, which necessitated consideration that the patient might be at risk for infection with the SARS-CoV-2 virus that causes COVID-19. Institutional protocols and algorithms that pertain to the evaluation of patients at risk for COVID-19 are in a state of rapid change based on information released by regulatory bodies including the CDC and federal and state organizations. These policies  and algorithms were followed during the patient's care in the ED.    As part of my medical decision making, I reviewed the following data within the electronic MEDICAL RECORD NUMBER Nursing notes reviewed and incorporated, Old chart reviewed, Radiograph reviewed , Notes from prior ED visits and [place] Controlled Substance Database  ____________________________________________   FINAL CLINICAL IMPRESSION(S) / ED DIAGNOSES  Final diagnoses:  Minor head injury, initial encounter  Abrasion      NEW MEDICATIONS STARTED DURING THIS VISIT:  Discharge Medication List as of 09/20/2020  2:58 PM       Note:  This document was prepared using Dragon voice recognition software and may include unintentional dictation errors.    Faythe Ghee, PA-C 09/20/20 1924    Sharman Cheek, MD 09/23/20 [phone]

## 2020-09-20 NOTE — ED Notes (Addendum)
UDS for workers comp walked to lab [place], [place]. Chain of custody maintained by NT. Patient provided discharge instructions, and patient denies concerns, needs or complaints. Patient provided ice pack as well. Patient verbalized understanding of follow up care, and discharge papers. Respirations even and unlabored, patient ambulatory with steady gait. Patient was provided with phone in lobby to contact a ride.
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
## 2020-09-20 NOTE — ED Notes (Signed)
Patient transported to CT 

## 2020-09-20 NOTE — Discharge Instructions (Signed)
Follow-up with your regular doctor if not improving to 3 days.  Return emergency department worsening.  Keep the area on your face clean and dry.  It has already begun to heal and does not need stitches

## 2020-09-20 NOTE — ED Notes (Signed)
Patient reports inability to provide urine sample. Patient notified that he will be discharged once the urine drug screen is completed. Patient verbalized understanding.

## 2020-09-20 NOTE — ED Notes (Signed)
Patient's employer contacted about patient reporting he is unable to provide a urine sample.
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
## 2020-09-20 NOTE — ED Notes (Signed)
Patient returned to ED. Urinal provided. Crackers also provided at patient request.

## 2021-10-18 ENCOUNTER — Telehealth: Payer: Self-pay | Admitting: *Deleted

## 2021-10-18 NOTE — Telephone Encounter (Signed)
Attempted to set up next [facility] CT Scan.Spoke with patient. He stated he was out of town and would like to call back when he is ready to set up. Provided him with the number to call and schedule once he returns.

## 2023-02-06 IMAGING — CT CT CHEST LUNG CANCER SCREENING LOW DOSE W/O CM
2 of 5 series · 15 of 40 positions shown, 18 images · non-contrast
Comparison: 03/15/2019 chest CT.

CLINICAL DATA: 65-year-old asymptomatic male current smoker with
22.5 pack-year smoking history.

EXAM:
CT CHEST WITHOUT CONTRAST LOW-DOSE FOR LUNG CANCER SCREENING
TECHNIQUE: Multidetector CT imaging of the chest was performed following the
standard protocol without IV contrast.

[Series 4: lung 1.00 · axial · 0.63mm/px · z∈[-1205,-889]mm · 12 of 348 slices shown, 15 images]
[im 16/348  mediastinal]
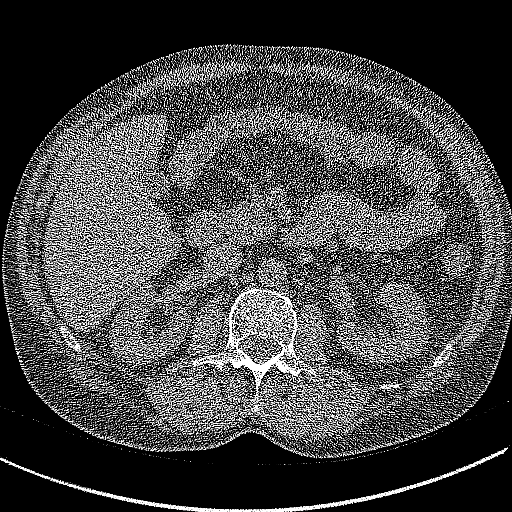
[im 16/348  lung]
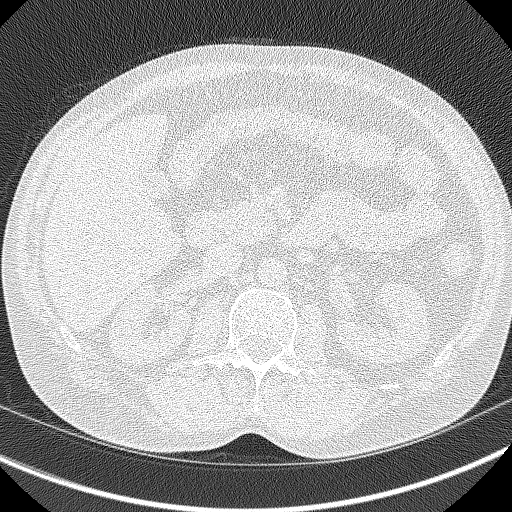
[im 48/348  lung]
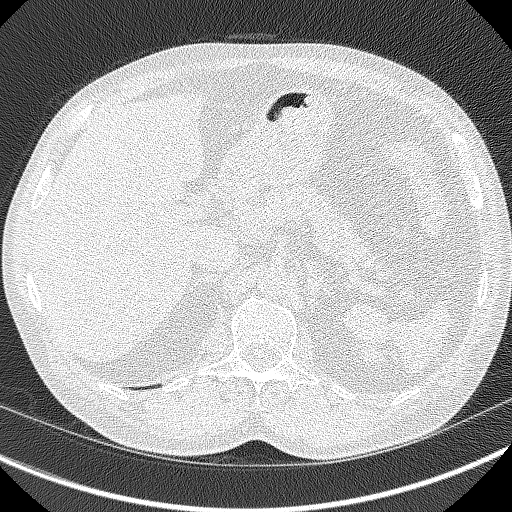
[im 79/348  lung]
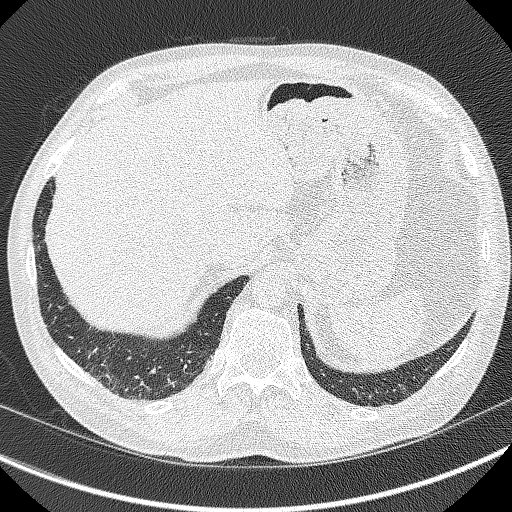
[im 111/348  lung]
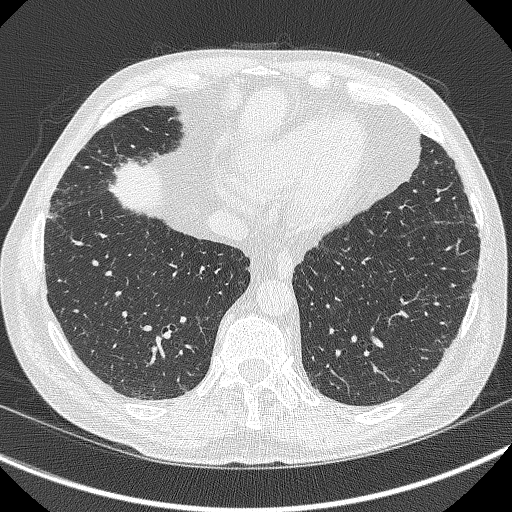
[im 127/348  mediastinal]
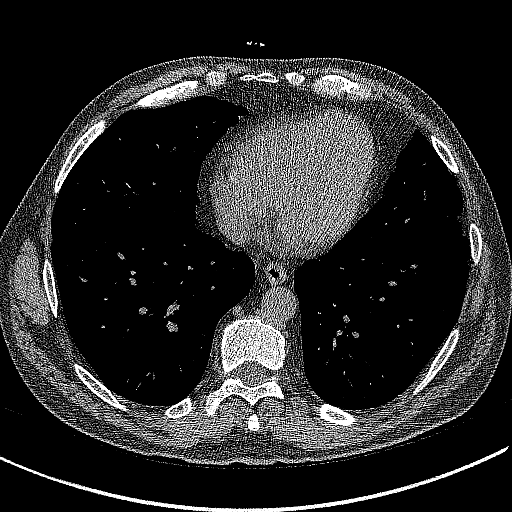
[im 127/348  lung]
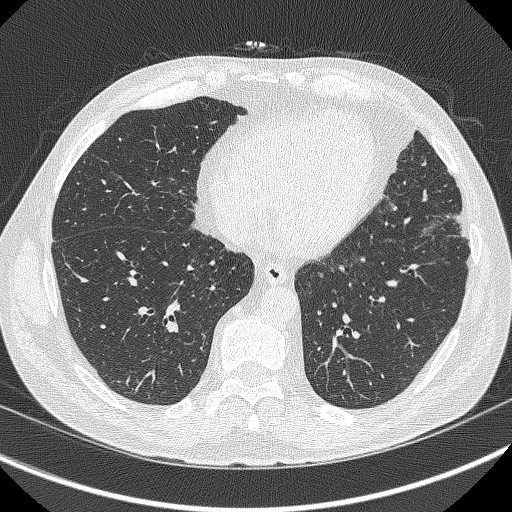
[im 158/348  lung]
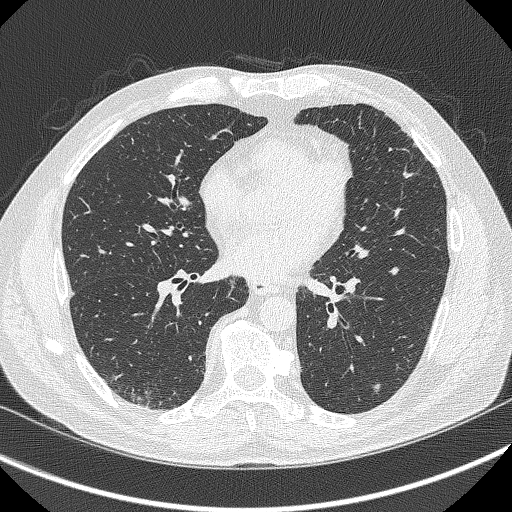
[im 190/348  lung]
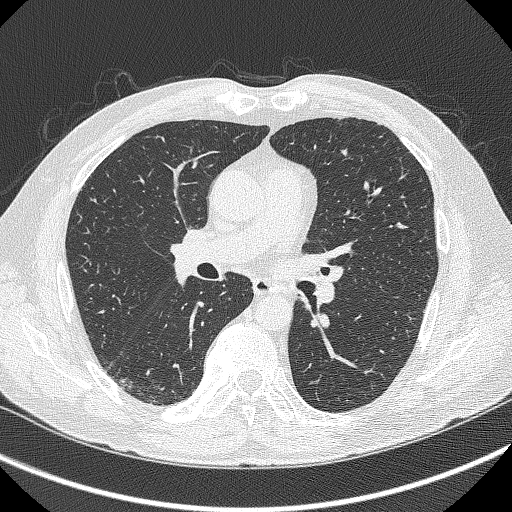
[im 221/348  lung]
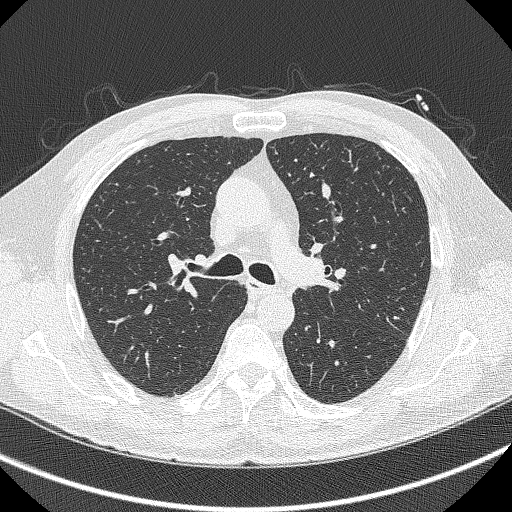
[im 237/348  mediastinal]
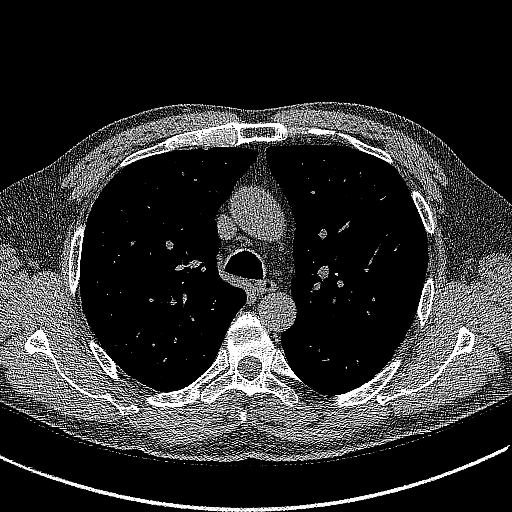
[im 237/348  lung]
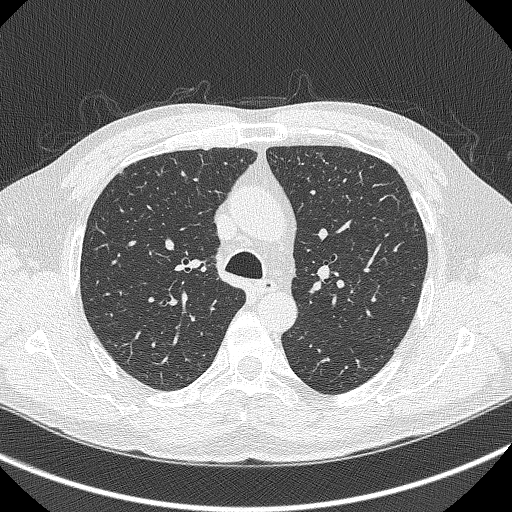
[im 269/348  lung]
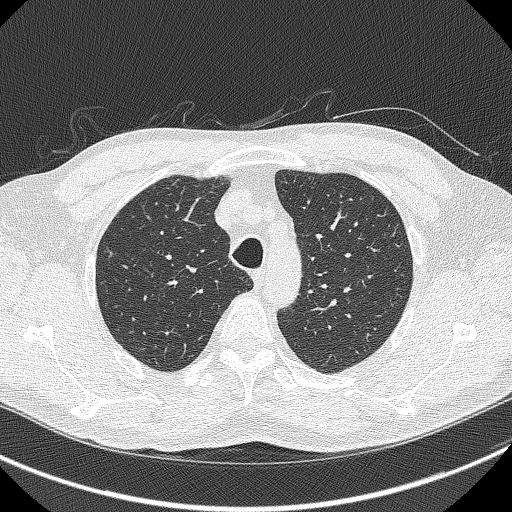
[im 300/348  lung]
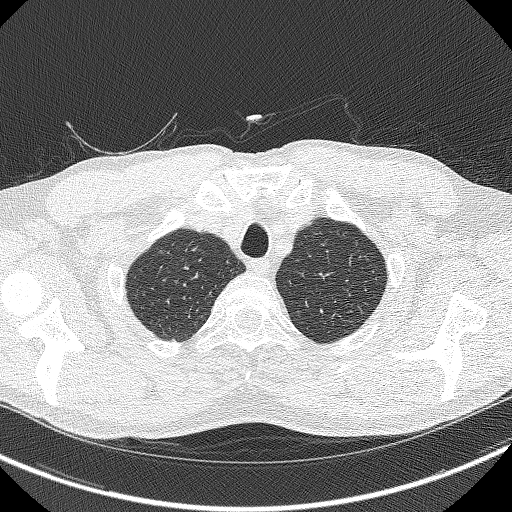
[im 332/348  lung]
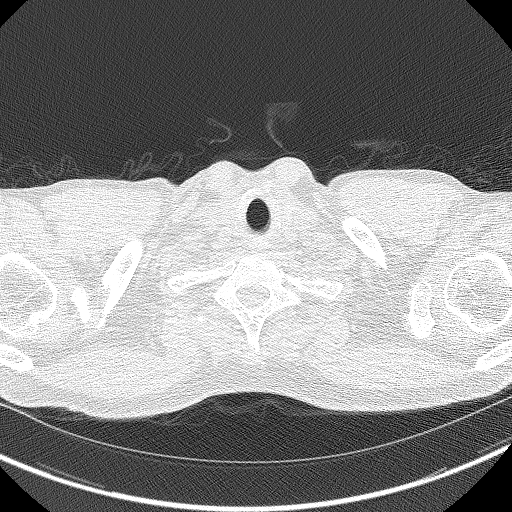

[Series 6: coronals lung 1.00 cor · coronal · 0.63mm/px · 3 of 272 slices shown]
[im 55/272  lung]
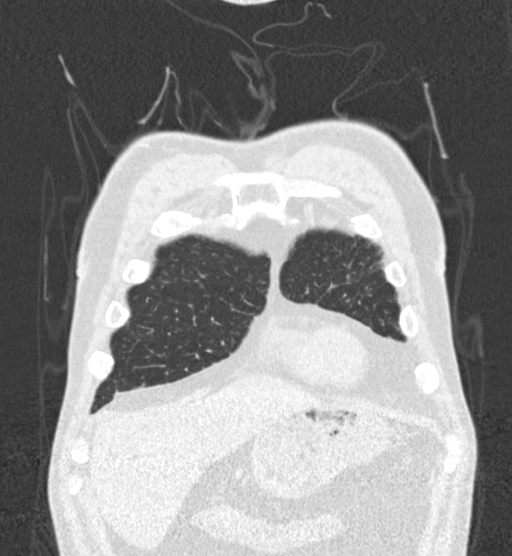
[im 109/272  lung]
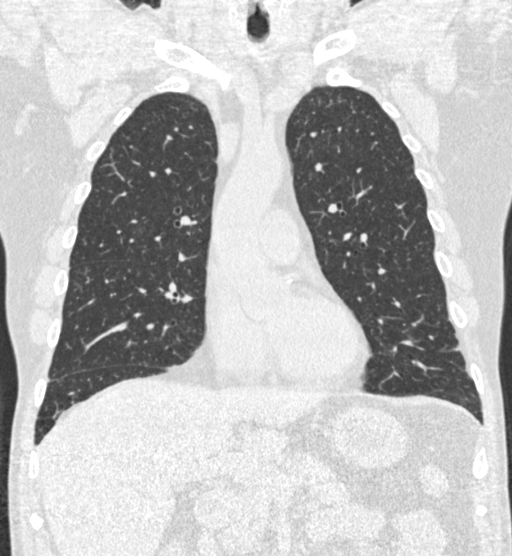
[im 163/272  lung]
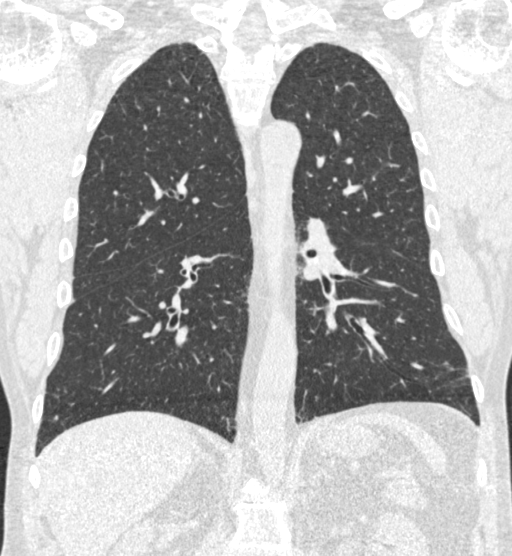

[15 of 40 positions shown; findings below may reference images not displayed]

FINDINGS: Cardiovascular: Normal heart size. No significant pericardial
effusion/thickening. Left anterior descending coronary
atherosclerosis. Great vessels are normal in course and caliber.

Mediastinum/Nodes: No discrete thyroid nodules. Unremarkable
esophagus. No pathologically enlarged axillary, mediastinal or hilar
lymph nodes, noting limited sensitivity for the detection of hilar
adenopathy on this noncontrast study. Coarse calcified granulomatous
left hilar nodes are unchanged.

Lungs/Pleura: No pneumothorax. No pleural effusion. Mild
centrilobular emphysema with mild diffuse bronchial wall thickening.
No acute consolidative airspace disease or lung masses. A few
scattered solid pulmonary nodules, largest 4.8 mm in volume derived
mean diameter along the minor fissure in the right lung (series
4/image 160), all unchanged from prior diagnostic chest CT. No new
significant pulmonary nodules.

Upper abdomen: No acute abnormality.

Musculoskeletal: No aggressive appearing focal osseous lesions. Mild
thoracic spondylosis.
IMPRESSION: 1. Lung-RADS 2, benign appearance or behavior. Continue annual
screening with low-dose chest CT without contrast in 12 months.
2. One vessel coronary atherosclerosis.
3. Emphysema ([id number]).
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
